# Patient Record
Sex: Female | Born: 1945
Health system: Southern US, Community
[De-identification: ages and names within clinical notes are randomized; demographics above are authoritative.]

## PROBLEM LIST (undated history)

## (undated) DIAGNOSIS — M2550 Pain in unspecified joint: Secondary | ICD-10-CM

## (undated) DIAGNOSIS — M7989 Other specified soft tissue disorders: Secondary | ICD-10-CM

## (undated) DIAGNOSIS — M549 Dorsalgia, unspecified: Secondary | ICD-10-CM

## (undated) DIAGNOSIS — I10 Essential (primary) hypertension: Secondary | ICD-10-CM

## (undated) DIAGNOSIS — K219 Gastro-esophageal reflux disease without esophagitis: Secondary | ICD-10-CM

## (undated) DIAGNOSIS — M1990 Unspecified osteoarthritis, unspecified site: Secondary | ICD-10-CM

## (undated) HISTORY — DX: Gastro-esophageal reflux disease without esophagitis: K21.9

## (undated) HISTORY — DX: Essential (primary) hypertension: I10

## (undated) HISTORY — DX: Dorsalgia, unspecified: M54.9

## (undated) HISTORY — DX: Pain in unspecified joint: M25.50

## (undated) HISTORY — DX: Other specified soft tissue disorders: M79.89

## (undated) HISTORY — DX: Unspecified osteoarthritis, unspecified site: M19.90

---

## 1998-01-02 ENCOUNTER — Other Ambulatory Visit: Admission: RE | Admit: 1998-01-02 | Discharge: 1998-01-02 | Payer: Self-pay | Admitting: Obstetrics and Gynecology

## 1999-01-17 ENCOUNTER — Other Ambulatory Visit: Admission: RE | Admit: 1999-01-17 | Discharge: 1999-01-17 | Payer: Self-pay | Admitting: Obstetrics and Gynecology

## 2000-03-16 ENCOUNTER — Other Ambulatory Visit: Admission: RE | Admit: 2000-03-16 | Discharge: 2000-03-16 | Payer: Self-pay | Admitting: Obstetrics and Gynecology

## 2001-04-01 ENCOUNTER — Other Ambulatory Visit: Admission: RE | Admit: 2001-04-01 | Discharge: 2001-04-01 | Payer: Self-pay | Admitting: Obstetrics and Gynecology

## 2002-04-04 ENCOUNTER — Other Ambulatory Visit: Admission: RE | Admit: 2002-04-04 | Discharge: 2002-04-04 | Payer: Self-pay | Admitting: Obstetrics and Gynecology

## 2003-12-25 ENCOUNTER — Emergency Department (HOSPITAL_COMMUNITY): Admission: EM | Admit: 2003-12-25 | Discharge: 2003-12-25 | Payer: Self-pay | Admitting: Emergency Medicine

## 2004-11-19 ENCOUNTER — Ambulatory Visit (HOSPITAL_COMMUNITY): Admission: RE | Admit: 2004-11-19 | Discharge: 2004-11-19 | Payer: Self-pay | Admitting: Obstetrics and Gynecology

## 2005-01-03 ENCOUNTER — Ambulatory Visit (HOSPITAL_COMMUNITY): Admission: RE | Admit: 2005-01-03 | Discharge: 2005-01-03 | Payer: Self-pay | Admitting: Obstetrics and Gynecology

## 2005-01-03 ENCOUNTER — Encounter (INDEPENDENT_AMBULATORY_CARE_PROVIDER_SITE_OTHER): Payer: Self-pay | Admitting: *Deleted

## 2005-04-21 ENCOUNTER — Ambulatory Visit (HOSPITAL_COMMUNITY): Admission: RE | Admit: 2005-04-21 | Discharge: 2005-04-21 | Payer: Self-pay | Admitting: Obstetrics and Gynecology

## 2005-09-03 IMAGING — CT CT HEAD W/O CM
1 of 2 series · 13 of 30 positions shown, 17 images · non-contrast
Comparison: none

CLINICAL DATA: Fell with headache. 
 HEAD CT WITHOUT CONTRAST
 5mm scans are made through the whole head.  Calvarium is unremarkable.  The paranasal sinuses, middle ears and mastoids are clear. The brain itself appears normal without evidence of atrophy, stroke, hemorrhage, hydrocephalus or extra-axial collection. 
 IMPRESSION 
 Normal exam.

[Series 2: brain · axial · 0.47mm/px · z∈[+136,+260]mm · 13 of 28 slices shown, 17 images]
[im 2/28  brain]
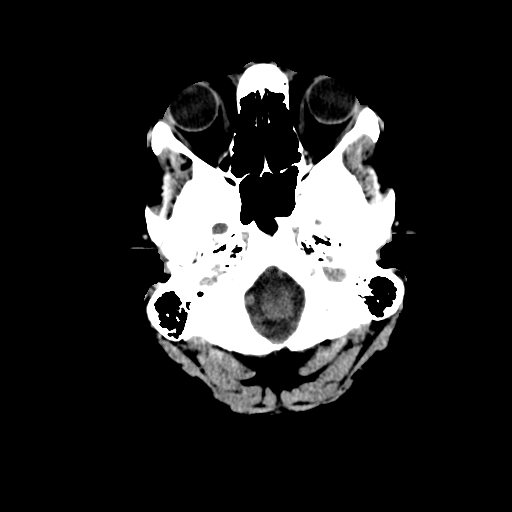
[im 2/28  bone]
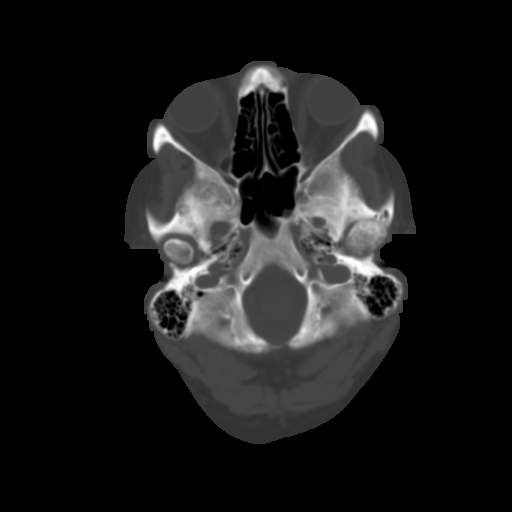
[im 4/28  brain]
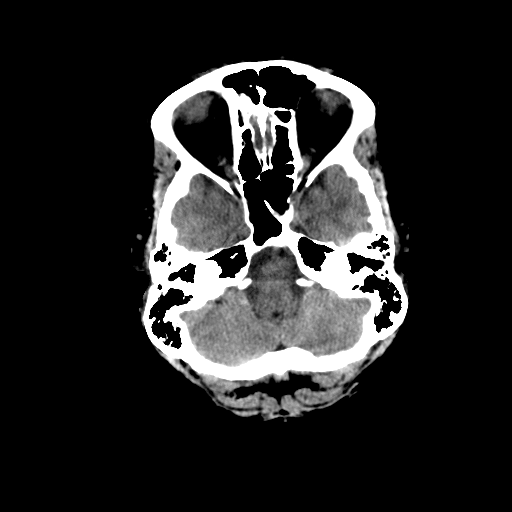
[im 6/28  brain]
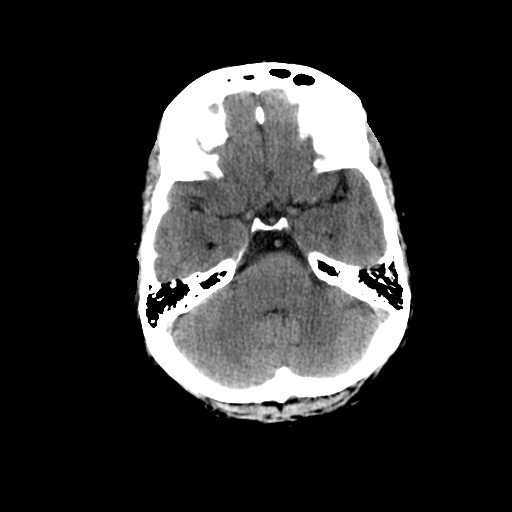
[im 8/28  brain]
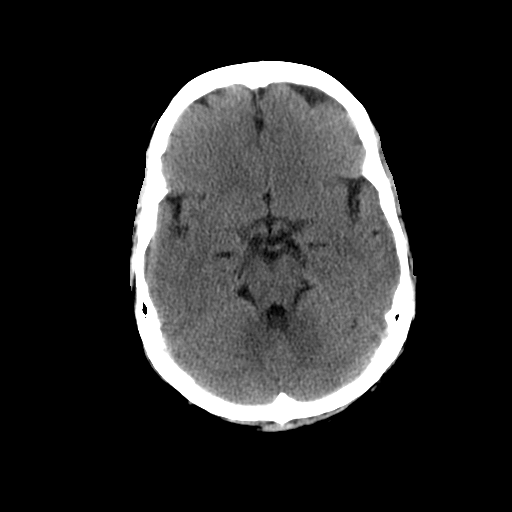
[im 10/28  brain]
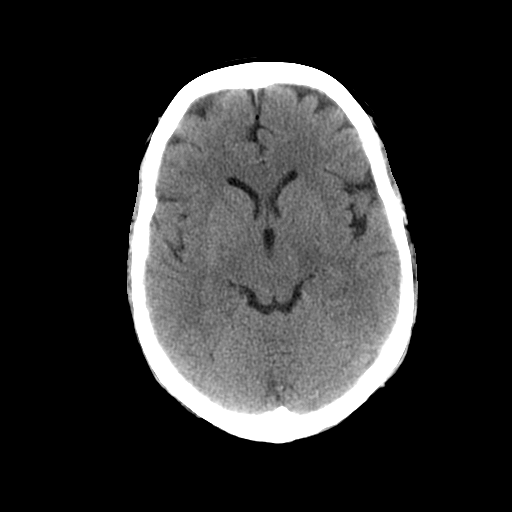
[im 10/28  bone]
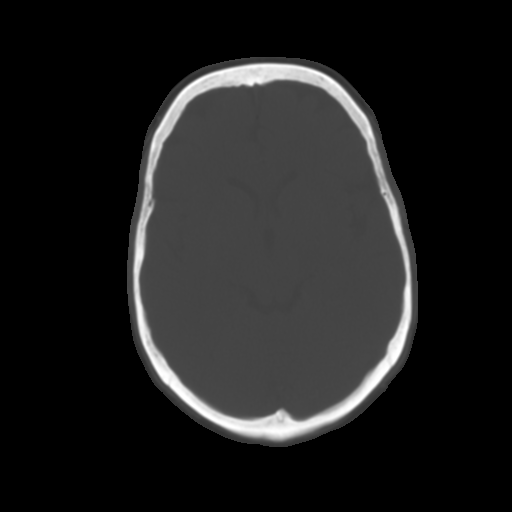
[im 12/28  brain]
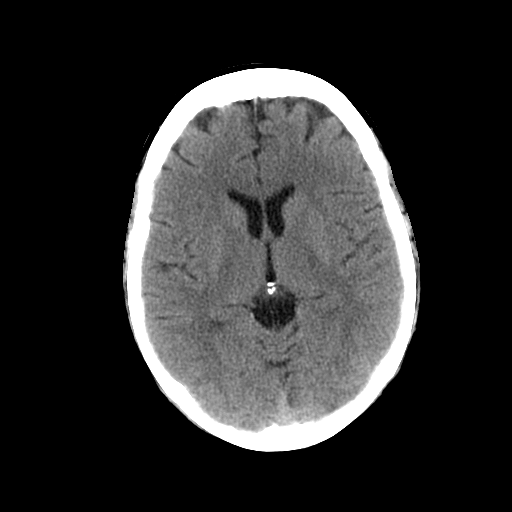
[im 14/28  brain]
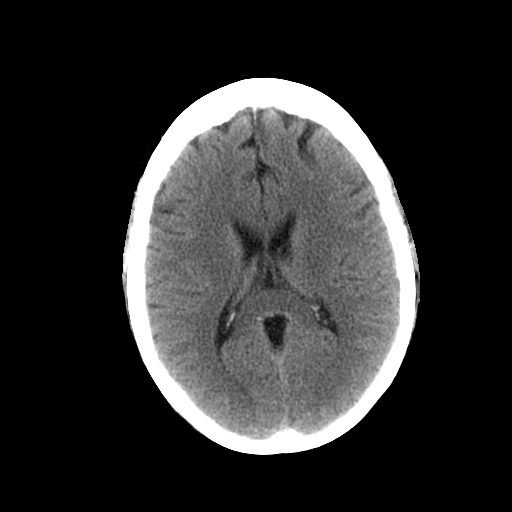
[im 16/28  brain]
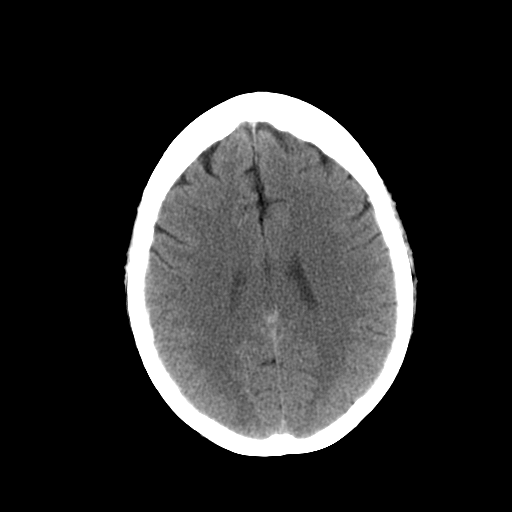
[im 18/28  brain]
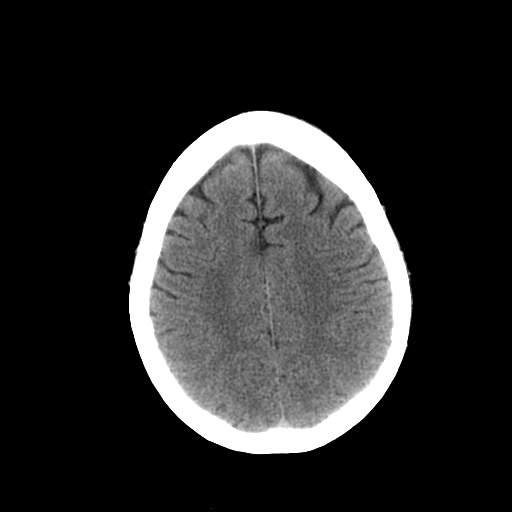
[im 18/28  bone]
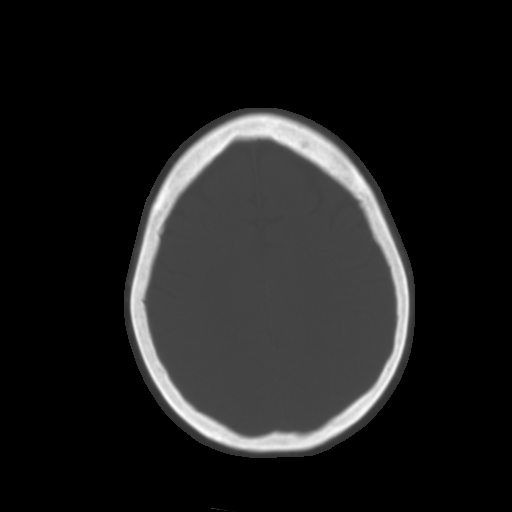
[im 20/28  brain]
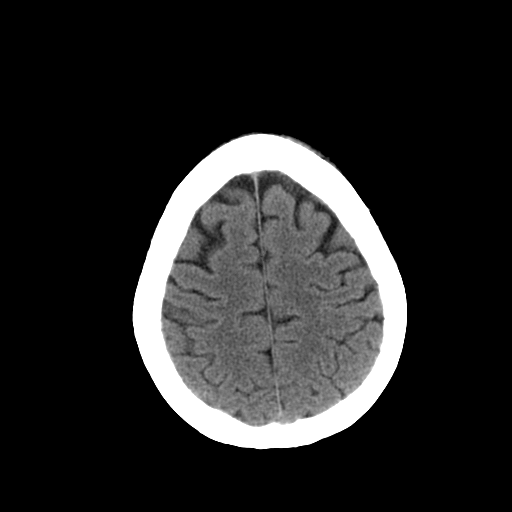
[im 22/28  brain]
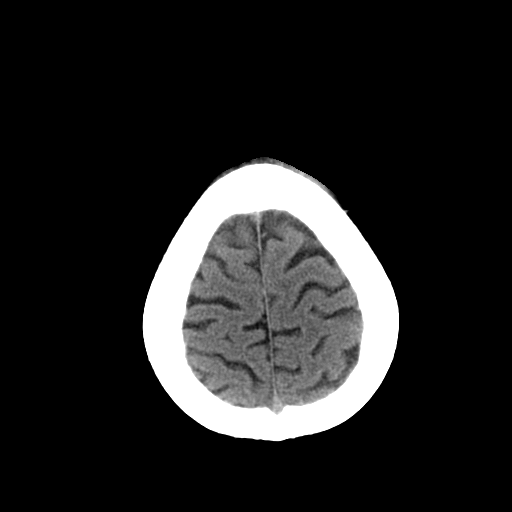
[im 24/28  brain]
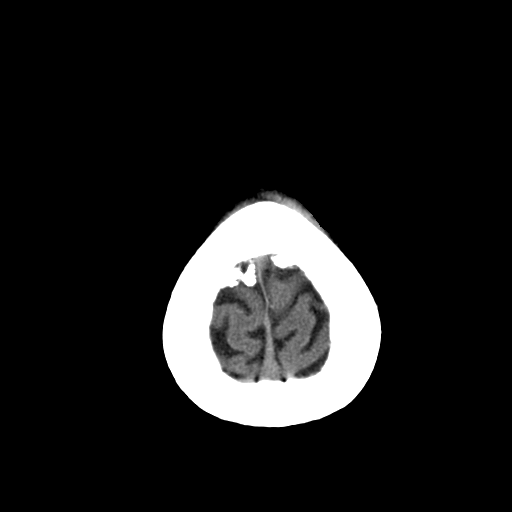
[im 26/28  brain]
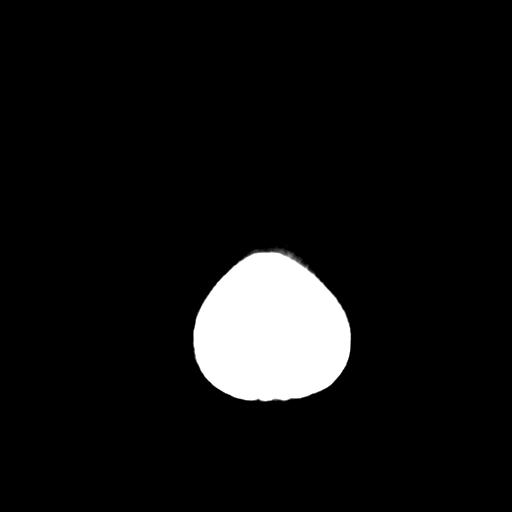
[im 26/28  bone]
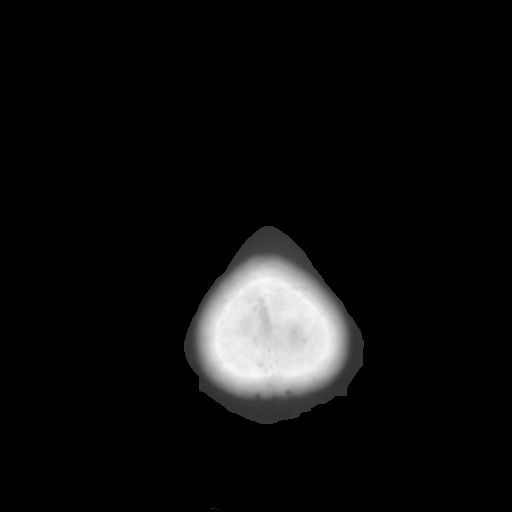

[13 of 30 positions shown; findings below may reference images not displayed]

## 2005-10-13 ENCOUNTER — Encounter (INDEPENDENT_AMBULATORY_CARE_PROVIDER_SITE_OTHER): Payer: Self-pay | Admitting: *Deleted

## 2005-10-13 ENCOUNTER — Ambulatory Visit (HOSPITAL_COMMUNITY): Admission: RE | Admit: 2005-10-13 | Discharge: 2005-10-13 | Payer: Self-pay | Admitting: Gastroenterology

## 2005-10-29 ENCOUNTER — Other Ambulatory Visit: Admission: RE | Admit: 2005-10-29 | Discharge: 2005-10-29 | Payer: Self-pay | Admitting: Obstetrics and Gynecology

## 2007-01-21 ENCOUNTER — Ambulatory Visit (HOSPITAL_COMMUNITY): Admission: RE | Admit: 2007-01-21 | Discharge: 2007-01-21 | Payer: Self-pay | Admitting: Obstetrics and Gynecology

## 2007-01-21 ENCOUNTER — Encounter (INDEPENDENT_AMBULATORY_CARE_PROVIDER_SITE_OTHER): Payer: Self-pay | Admitting: Obstetrics and Gynecology

## 2007-08-31 ENCOUNTER — Ambulatory Visit (HOSPITAL_COMMUNITY): Admission: RE | Admit: 2007-08-31 | Discharge: 2007-09-01 | Payer: Self-pay | Admitting: Obstetrics and Gynecology

## 2007-08-31 ENCOUNTER — Encounter (INDEPENDENT_AMBULATORY_CARE_PROVIDER_SITE_OTHER): Payer: Self-pay | Admitting: Obstetrics and Gynecology

## 2009-02-16 ENCOUNTER — Inpatient Hospital Stay (HOSPITAL_COMMUNITY): Admission: RE | Admit: 2009-02-16 | Discharge: 2009-02-19 | Payer: Self-pay | Admitting: Orthopedic Surgery

## 2009-08-17 ENCOUNTER — Inpatient Hospital Stay (HOSPITAL_COMMUNITY): Admission: RE | Admit: 2009-08-17 | Discharge: 2009-08-20 | Payer: Self-pay | Admitting: Orthopedic Surgery

## 2010-06-17 LAB — COMPREHENSIVE METABOLIC PANEL
ALT: 21 U/L (ref 0–35)
AST: 23 U/L (ref 0–37)
Albumin: 4.3 g/dL (ref 3.5–5.2)
Alkaline Phosphatase: 94 U/L (ref 39–117)
BUN: 9 mg/dL (ref 6–23)
CO2: 28 mEq/L (ref 19–32)
Calcium: 9.8 mg/dL (ref 8.4–10.5)
Chloride: 107 mEq/L (ref 96–112)
Creatinine, Ser: 0.71 mg/dL (ref 0.4–1.2)
GFR calc Af Amer: >60 mL/min (ref >60)
GFR calc non Af Amer: >60 mL/min (ref >60)
Glucose, Bld: 92 mg/dL (ref 70–99)
Potassium: 4.7 mEq/L (ref 3.5–5.1)
Sodium: 140 mEq/L (ref 135–145)
Total Bilirubin: 1.1 mg/dL (ref 0.3–1.2)
Total Protein: 6.8 g/dL (ref 6.0–8.3)

## 2010-06-17 LAB — CBC
HCT: 32.4 % — ABNORMAL LOW (ref 36.0–46.0)
HCT: 33.8 % — ABNORMAL LOW (ref 36.0–46.0)
HCT: 35.2 % — ABNORMAL LOW (ref 36.0–46.0)
HCT: 42.3 % (ref 36.0–46.0)
Hemoglobin: 11 g/dL — ABNORMAL LOW (ref 12.0–15.0)
Hemoglobin: 11.5 g/dL — ABNORMAL LOW (ref 12.0–15.0)
Hemoglobin: 12.1 g/dL (ref 12.0–15.0)
Hemoglobin: 14.6 g/dL (ref 12.0–15.0)
MCHC: 34 g/dL (ref 30.0–36.0)
MCHC: 34 g/dL (ref 30.0–36.0)
MCHC: 34.4 g/dL (ref 30.0–36.0)
MCHC: 34.4 g/dL (ref 30.0–36.0)
MCV: 88.6 fL (ref 78.0–100.0)
MCV: 89.1 fL (ref 78.0–100.0)
MCV: 89.1 fL (ref 78.0–100.0)
MCV: 89.1 fL (ref 78.0–100.0)
Platelets: 235 10*3/uL (ref 150–400)
Platelets: 261 10*3/uL (ref 150–400)
Platelets: 263 10*3/uL (ref 150–400)
Platelets: 303 10*3/uL (ref 150–400)
RBC: 3.64 MIL/uL — ABNORMAL LOW (ref 3.87–5.11)
RBC: 3.8 MIL/uL — ABNORMAL LOW (ref 3.87–5.11)
RBC: 3.95 MIL/uL (ref 3.87–5.11)
RBC: 4.78 MIL/uL (ref 3.87–5.11)
RDW: 14 % (ref 11.5–15.5)
RDW: 14.1 % (ref 11.5–15.5)
RDW: 14.1 % (ref 11.5–15.5)
RDW: 14.1 % (ref 11.5–15.5)
WBC: 11.4 10*3/uL — ABNORMAL HIGH (ref 4.0–10.5)
WBC: 11.6 10*3/uL — ABNORMAL HIGH (ref 4.0–10.5)
WBC: 12.4 10*3/uL — ABNORMAL HIGH (ref 4.0–10.5)
WBC: 8.4 10*3/uL (ref 4.0–10.5)

## 2010-06-17 LAB — PROTIME-INR
INR: 0.99 (ref 0.00–1.49)
INR: 1.1 (ref 0.00–1.49)
INR: 2.03 — ABNORMAL HIGH (ref 0.00–1.49)
INR: 2.22 — ABNORMAL HIGH (ref 0.00–1.49)
Prothrombin Time: 13 seconds (ref 11.6–15.2)
Prothrombin Time: 14.1 seconds (ref 11.6–15.2)
Prothrombin Time: 22.8 seconds — ABNORMAL HIGH (ref 11.6–15.2)
Prothrombin Time: 24.4 seconds — ABNORMAL HIGH (ref 11.6–15.2)

## 2010-06-17 LAB — BASIC METABOLIC PANEL
BUN: 5 mg/dL — ABNORMAL LOW (ref 6–23)
BUN: 5 mg/dL — ABNORMAL LOW (ref 6–23)
BUN: 5 mg/dL — ABNORMAL LOW (ref 6–23)
CO2: 26 mEq/L (ref 19–32)
CO2: 28 mEq/L (ref 19–32)
CO2: 29 mEq/L (ref 19–32)
Calcium: 8.6 mg/dL (ref 8.4–10.5)
Calcium: 8.6 mg/dL (ref 8.4–10.5)
Calcium: 8.9 mg/dL (ref 8.4–10.5)
Chloride: 103 mEq/L (ref 96–112)
Chloride: 103 mEq/L (ref 96–112)
Chloride: 105 mEq/L (ref 96–112)
Creatinine, Ser: 0.65 mg/dL (ref 0.4–1.2)
Creatinine, Ser: 0.73 mg/dL (ref 0.4–1.2)
Creatinine, Ser: 0.74 mg/dL (ref 0.4–1.2)
GFR calc Af Amer: >60 mL/min (ref >60)
GFR calc Af Amer: >60 mL/min (ref >60)
GFR calc Af Amer: >60 mL/min (ref >60)
GFR calc non Af Amer: >60 mL/min (ref >60)
GFR calc non Af Amer: >60 mL/min (ref >60)
GFR calc non Af Amer: >60 mL/min (ref >60)
Glucose, Bld: 105 mg/dL — ABNORMAL HIGH (ref 70–99)
Glucose, Bld: 112 mg/dL — ABNORMAL HIGH (ref 70–99)
Glucose, Bld: 129 mg/dL — ABNORMAL HIGH (ref 70–99)
Potassium: 3.6 mEq/L (ref 3.5–5.1)
Potassium: 3.6 mEq/L (ref 3.5–5.1)
Potassium: 3.8 mEq/L (ref 3.5–5.1)
Sodium: 136 mEq/L (ref 135–145)
Sodium: 137 mEq/L (ref 135–145)
Sodium: 141 mEq/L (ref 135–145)

## 2010-06-17 LAB — URINALYSIS, ROUTINE W REFLEX MICROSCOPIC
Bilirubin Urine: NEGATIVE
Glucose, UA: NEGATIVE mg/dL
Hgb urine dipstick: NEGATIVE
Ketones, ur: NEGATIVE mg/dL
Nitrite: NEGATIVE
Protein, ur: NEGATIVE mg/dL
Specific Gravity, Urine: 1.015 (ref 1.005–1.030)
Urobilinogen, UA: 0.2 mg/dL (ref 0.0–1.0)
pH: 6 (ref 5.0–8.0)

## 2010-06-17 LAB — URINE MICROSCOPIC-ADD ON

## 2010-06-17 LAB — TYPE AND SCREEN
ABO/RH(D): B POS
Antibody Screen: NEGATIVE

## 2010-06-17 LAB — DIFFERENTIAL
Basophils Absolute: 0 10*3/uL (ref 0.0–0.1)
Basophils Relative: 0 % (ref 0–1)
Eosinophils Absolute: 0.2 10*3/uL (ref 0.0–0.7)
Eosinophils Relative: 3 % (ref 0–5)
Lymphocytes Relative: 28 % (ref 12–46)
Lymphs Abs: 2.4 10*3/uL (ref 0.7–4.0)
Monocytes Absolute: 0.4 10*3/uL (ref 0.1–1.0)
Monocytes Relative: 4 % (ref 3–12)
Neutro Abs: 5.5 10*3/uL (ref 1.7–7.7)
Neutrophils Relative %: 65 % (ref 43–77)

## 2010-06-17 LAB — APTT: aPTT: 27 seconds (ref 24–37)

## 2010-07-03 LAB — ABO/RH: ABO/RH(D): B POS

## 2010-07-03 LAB — CBC
HCT: 30.9 % — ABNORMAL LOW (ref 36.0–46.0)
HCT: 33 % — ABNORMAL LOW (ref 36.0–46.0)
HCT: 33 % — ABNORMAL LOW (ref 36.0–46.0)
HCT: 45 % (ref 36.0–46.0)
Hemoglobin: 10.5 g/dL — ABNORMAL LOW (ref 12.0–15.0)
Hemoglobin: 11.4 g/dL — ABNORMAL LOW (ref 12.0–15.0)
Hemoglobin: 11.5 g/dL — ABNORMAL LOW (ref 12.0–15.0)
Hemoglobin: 15.4 g/dL — ABNORMAL HIGH (ref 12.0–15.0)
MCHC: 34 g/dL (ref 30.0–36.0)
MCHC: 34.3 g/dL (ref 30.0–36.0)
MCHC: 34.6 g/dL (ref 30.0–36.0)
MCHC: 34.8 g/dL (ref 30.0–36.0)
MCV: 89.6 fL (ref 78.0–100.0)
MCV: 89.9 fL (ref 78.0–100.0)
MCV: 90.1 fL (ref 78.0–100.0)
MCV: 91.3 fL (ref 78.0–100.0)
Platelets: 202 10*3/uL (ref 150–400)
Platelets: 212 10*3/uL (ref 150–400)
Platelets: 236 10*3/uL (ref 150–400)
Platelets: 316 10*3/uL (ref 150–400)
RBC: 3.39 MIL/uL — ABNORMAL LOW (ref 3.87–5.11)
RBC: 3.67 MIL/uL — ABNORMAL LOW (ref 3.87–5.11)
RBC: 3.68 MIL/uL — ABNORMAL LOW (ref 3.87–5.11)
RBC: 5 MIL/uL (ref 3.87–5.11)
RDW: 13.6 % (ref 11.5–15.5)
RDW: 13.6 % (ref 11.5–15.5)
RDW: 13.7 % (ref 11.5–15.5)
RDW: 13.8 % (ref 11.5–15.5)
WBC: 11.2 10*3/uL — ABNORMAL HIGH (ref 4.0–10.5)
WBC: 14.1 10*3/uL — ABNORMAL HIGH (ref 4.0–10.5)
WBC: 14.6 10*3/uL — ABNORMAL HIGH (ref 4.0–10.5)
WBC: 9.6 10*3/uL (ref 4.0–10.5)

## 2010-07-03 LAB — COMPREHENSIVE METABOLIC PANEL
ALT: 30 U/L (ref 0–35)
AST: 27 U/L (ref 0–37)
Albumin: 4.4 g/dL (ref 3.5–5.2)
Alkaline Phosphatase: 102 U/L (ref 39–117)
BUN: 15 mg/dL (ref 6–23)
CO2: 30 mEq/L (ref 19–32)
Calcium: 10 mg/dL (ref 8.4–10.5)
Chloride: 104 mEq/L (ref 96–112)
Creatinine, Ser: 0.76 mg/dL (ref 0.4–1.2)
GFR calc Af Amer: >60 mL/min (ref >60)
GFR calc non Af Amer: >60 mL/min (ref >60)
Glucose, Bld: 91 mg/dL (ref 70–99)
Potassium: 4.9 mEq/L (ref 3.5–5.1)
Sodium: 140 mEq/L (ref 135–145)
Total Bilirubin: 1 mg/dL (ref 0.3–1.2)
Total Protein: 7 g/dL (ref 6.0–8.3)

## 2010-07-03 LAB — PROTIME-INR
INR: 0.95 (ref 0.00–1.49)
INR: 1.1 (ref 0.00–1.49)
INR: 2.08 — ABNORMAL HIGH (ref 0.00–1.49)
INR: 2.22 — ABNORMAL HIGH (ref 0.00–1.49)
Prothrombin Time: 12.6 seconds (ref 11.6–15.2)
Prothrombin Time: 14.1 seconds (ref 11.6–15.2)
Prothrombin Time: 23.2 seconds — ABNORMAL HIGH (ref 11.6–15.2)
Prothrombin Time: 24.4 seconds — ABNORMAL HIGH (ref 11.6–15.2)

## 2010-07-03 LAB — BASIC METABOLIC PANEL
BUN: 6 mg/dL (ref 6–23)
CO2: 26 mEq/L (ref 19–32)
Calcium: 8.2 mg/dL — ABNORMAL LOW (ref 8.4–10.5)
Chloride: 102 mEq/L (ref 96–112)
Creatinine, Ser: 0.59 mg/dL (ref 0.4–1.2)
GFR calc Af Amer: >60 mL/min (ref >60)
GFR calc non Af Amer: >60 mL/min (ref >60)
Glucose, Bld: 121 mg/dL — ABNORMAL HIGH (ref 70–99)
Potassium: 3.5 mEq/L (ref 3.5–5.1)
Sodium: 133 mEq/L — ABNORMAL LOW (ref 135–145)

## 2010-07-03 LAB — DIFFERENTIAL
Basophils Absolute: 0.2 10*3/uL — ABNORMAL HIGH (ref 0.0–0.1)
Basophils Relative: 2 % — ABNORMAL HIGH (ref 0–1)
Eosinophils Absolute: 0.3 10*3/uL (ref 0.0–0.7)
Eosinophils Relative: 2 % (ref 0–5)
Lymphocytes Relative: 21 % (ref 12–46)
Lymphs Abs: 3 10*3/uL (ref 0.7–4.0)
Monocytes Absolute: 0.6 10*3/uL (ref 0.1–1.0)
Monocytes Relative: 5 % (ref 3–12)
Neutro Abs: 10.4 10*3/uL — ABNORMAL HIGH (ref 1.7–7.7)
Neutrophils Relative %: 71 % (ref 43–77)

## 2010-07-03 LAB — URINALYSIS, ROUTINE W REFLEX MICROSCOPIC
Bilirubin Urine: NEGATIVE
Glucose, UA: NEGATIVE mg/dL
Hgb urine dipstick: NEGATIVE
Ketones, ur: 15 mg/dL — AB
Nitrite: NEGATIVE
Protein, ur: NEGATIVE mg/dL
Specific Gravity, Urine: 1.011 (ref 1.005–1.030)
Urobilinogen, UA: 0.2 mg/dL (ref 0.0–1.0)
pH: 6 (ref 5.0–8.0)

## 2010-07-03 LAB — URINE MICROSCOPIC-ADD ON

## 2010-07-03 LAB — TYPE AND SCREEN
ABO/RH(D): B POS
Antibody Screen: NEGATIVE

## 2010-07-03 LAB — APTT: aPTT: 26 seconds (ref 24–37)

## 2010-07-25 ENCOUNTER — Other Ambulatory Visit: Payer: Self-pay | Admitting: Gastroenterology

## 2010-07-31 ENCOUNTER — Ambulatory Visit
Admission: RE | Admit: 2010-07-31 | Discharge: 2010-07-31 | Disposition: A | Discharge status: Home / Self Care | Payer: Medicare Other | Source: Ambulatory Visit | Attending: Gastroenterology | Admitting: Gastroenterology

## 2010-08-13 NOTE — H&P (Signed)
NAMEALYRIC, PARKIN                 ACCOUNT NO.:  [id number]   MEDICAL RECORD NO.:  [id number]          PATIENT TYPE:  AMB   LOCATION:                                FACILITY:  [facility]   PHYSICIAN:  Hal Morales, M.D.DATE OF BIRTH:  [birth year]   DATE OF ADMISSION:  08/31/2007  DATE OF DISCHARGE:                              HISTORY & PHYSICAL   HISTORY OF PRESENT ILLNESS:  [name] is a 65 year old divorced white  female para 2-0-0-2, who presents for laparoscopically-assisted vaginal  hysterectomy with bilateral salpingo-oophorectomy because of persistent  postmenopausal bleeding.  This patient has a longstanding history of  postmenopausal spotting which lately has only occurred whenever she has  an orgasm.  She denies any dyspareunia, pelvic pain, vaginitis symptoms,  changes in her bowel movements, or urinary tract symptoms.  In August  2006, the patient underwent an endometrial biopsy for her postmenopausal  spotting and it returned complex atypical hyperplasia with extensive  squamous metaplasia.  She subsequently had a hysteroscopy D&C in October  2006 which showed extensive squamous morule formation (which can be seen  in hyperplasia, cancer, or reactive normal endometrium).  At this time,  the patient was placed on daily Provera 30 mg for 90 days ago, during  which time she reported daily spotting.  Once the patient discontinued  her daily Provera, however, she ceased to bleed.  A followup endometrial  biopsy in January 2007 returned benign inactive endometrium and another  in April 2007 was also benign.  The patient had essentially no further  bleeding until August 2008, at which time she began to experience  spotting whenever she wipes along with spotting preceded by orgasm.  A  sonohystogram in August 2008 showed a uterus measuring 9.31 x 5.98 x  4.88 with white calcification measuring 0.72 x 0.52 x 1.1 cm.  The  patient's endometrium was assessed at 0.29 cm, but there was  noticed  anterior 2 or within the endometrium 3 masses measuring 0.66, 0.59, and  0.53 cm.  The myometrium was observed to have diffused uterine blood  flow which was suggestive of adenomyosis.  The patient later that month  underwent a pelvic MRI which confirmed the presence of adenomyosis and  documented that the patient's endometrium was thickened to between 11-12  mm.  A followup hysteroscopy D&C in October 2008 returned benign  findings.  The patient has continued to experience spotting with  orgasms, and at this time has decided to choose definitive therapy for  management, though she has been given both medical and surgical options.   OB HISTORY:  Gravida 2, para 2-0-0-2.  The patient had two spontaneous  vaginal births in [birth year] and 1980.   GYN HISTORY:  Menarche at 66 years old.  The patient is menopausal, has  had a bilateral tubal ligation.  Denies any history of sexually  transmitted diseases or abnormal Pap smears.  Last normal Pap smear was  August 2008.  Last normal mammogram was August 2008.   MEDICAL HISTORY:  Osteopenia, vitamin D deficiency, arthritis, pneumonia  on six separate occasions,  adenomatous endometrial hyperplasia,  hypertension, insomnia, adenomyosis, diverticulosis, fracture of  cervical spine at level of C2 in October 2005, and hyperlipidemia.   SURGICAL HISTORY:  Tonsillectomy 1954, 1977 D&C because of menorrhagia,  1982 bilateral tubal ligation, 2006 hysteroscopy with D&C showing  complex atypical hyperplasia, 2008 hysteroscopy D&C benign findings.  The patient admits to problems of awakening after anesthesia accompanied  by severe nausea.  Denies any history of blood transfusions.   FAMILY HISTORY:  Stroke, hypertension, diabetes mellitus, and COPD.   HABITS:  The patient does not use tobacco.  She drinks alcohol on  occasion and she is vegetarian.   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   CURRENT  MEDICATIONS:  1. Tramadol 50 mg at bedtime.  2. Etolac 400 mg as needed.  3. Cyclobenzaprine 10 mg three times daily as needed.  4. Amlodipine 10 mg daily.  5. Ambien CR 12.5 mg as needed.  6. Multivitamin one tablet daily.  7. Vitamin C with a B complex one tablet daily.  8. Calcium with vitamin D daily greens with soy daily.   ALLERGIES:  TETANUS TOXOID which causes her to swell.   REVIEW OF SYSTEMS:  The patient wears glasses.  She has bilateral knee  pain secondary to a recent fall.  Admits to occasional palpitations.  Denies any chest pain, shortness of breath, dizziness, headache, nausea,  vomiting, diarrhea, fever, and except as mentioned in history of present  illness, the patient's review of systems is negative.   PHYSICAL EXAMINATION:  Blood pressure 124/80, pulse is 100, weight is  198, height is 5 feet.  GENERAL:  Neck is supple without masses.  There is no thyromegaly or  cervical adenopathy.  HEART:  Regular rhythm, though the patient is mildly tachycardiac with a  heart rate of 100.  LUNGS:  Clear.  BACK:  No CVA tenderness.  ABDOMEN:  No tenderness, masses, or organomegaly.  EXTREMITIES:  No clubbing, cyanosis, or edema.  PELVIC:  EGBUS is within normal limits.  Vagina is atrophic.  Cervix is  nontender without lesions.  Uterus appears normal size, shape, and  consistency without tenderness, though the patient's exam is limited by  body habitus.  Adnexa, no tenderness or masses.   IMPRESSION:  1. Postmenopausal bleeding.  2. Adenomyosis.  3. History of adenomatous endometrial hyperplasia.   DISPOSITION:  A discussion was held with the patient regarding the  indications for her procedure along with its risks which include but are  not limited to reaction to anesthesia, damage to adjacent organs,  infection, and excessive bleeding.  The patient was given a MiraLax  bowel prep to be administered 24 hours prior to her procedure.  She has  consented to proceed  with a laparoscopically-assisted vaginal  hysterectomy with bilateral salpingo-oophorectomy with the possibility  of a total abdominal hysterectomy and bilateral salpingo-oophorectomy at  [facility] [name] on August 31, 2007, at 7:30 a.m.      Elmira J. Adline Peals.      Hal Morales, M.D.  Electronically Signed    EJP/MEDQ  D:  08/29/2007  T:  08/30/2007  Job:  [id number]

## 2010-08-13 NOTE — Op Note (Signed)
[name], [name]                 ACCOUNT NO.:  [id number]   MEDICAL RECORD NO.:  [id number]          PATIENT TYPE:  AMB   LOCATION:  SDC                           FACILITY:  [facility]   PHYSICIAN:  Hal Morales, M.D.DATE OF BIRTH:  1945/06/15   DATE OF PROCEDURE:  01/21/2007  DATE OF DISCHARGE:                               OPERATIVE REPORT   PREOPERATIVE DIAGNOSES:  1. Postmenopausal bleeding.  2. Adenomyosis.   POSTOPERATIVE DIAGNOSES:  1. Postmenopausal bleeding.  2. Adenomyosis.  3. Atrophic endometrium.   PROCEDURE:  Hysteroscopy, dilatation and curettage.   SURGEON:  Hal Morales, M.D.   ANESTHESIA:  General orotracheal.   ESTIMATED BLOOD LOSS:  Less than 10 mL.   COMPLICATIONS:  None.   FINDINGS:  The uterus sounded to 8 cm.  At the time of hysteroscopy the  endometrium was very thin with no endometrial lesions.   SPECIMENS TO PATHOLOGY:  Endometrial curettings and endocervical  curettings.   PROCEDURE:  The patient was taken to the operating room after  appropriate identification and placed on the operating table.  After the  attainment of adequate general anesthesia, she was placed in the  lithotomy position.  The perineum and vagina were prepped with multiple  layers of Betadine and the bladder emptied with an in-and-out catheter.  The perineum was draped as a sterile field.  A Graves speculum was  placed in the vagina and a single-tooth tenaculum placed on the anterior  cervix.  The uterus was sounded.  The cervix was dilated to accommodate  a small hysteroscope and the hysteroscope was used to identify and  document the above-noted findings.  The hysteroscope was removed and all  four quadrants of the endocervical canal curetted and all four quadrants  of the  endometrial canal curetted.  All instruments were then removed from the  vagina and the patient awakened from general anesthesia and taken to the  recovery room in satisfactory condition,  having tolerated the procedure  well with sponge and instrument count correct.      Hal Morales, M.D.  Electronically Signed     VPH/MEDQ  D:  01/21/2007  T:  01/22/2007  Job:  [id number]

## 2010-08-13 NOTE — H&P (Signed)
NAMEJODEAN, [name]                 ACCOUNT NO.:  [id number]   MEDICAL RECORD NO.:  [id number]          PATIENT TYPE:  AMB   LOCATION:  SDC                           [facility]   PHYSICIAN:  Hal Morales, M.D.DATE OF BIRTH:  19-Oct-1945   DATE OF ADMISSION:  DATE OF DISCHARGE:                              HISTORY & PHYSICAL   ADDENDUM:   IMPRESSION:  Tachycardia.   DISPOSITION:  The patient was seen by her primary care physician, Dr.  Andi Devon and was cleared for surgery in relationship to her  tachycardia.      [name].      Hal Morales, M.D.  Electronically Signed    EJP/MEDQ  D:  08/30/2007  T:  08/31/2007  Job:  [id number]

## 2010-08-13 NOTE — H&P (Signed)
[name], [name]                 ACCOUNT NO.:  [id number]   MEDICAL RECORD NO.:  [id number]          PATIENT TYPE:  AMB   LOCATION:                                [facility]   PHYSICIAN:  Hal Morales, M.D.DATE OF BIRTH:  05/20/1945   DATE OF ADMISSION:  01/21/2007  DATE OF DISCHARGE:  01/21/2007                              HISTORY & PHYSICAL   The patient is a 65 year old white female who presents for further  evaluation of postmenopausal bleeding.  The patient had been in her  usual state of gynecologic health until the spring of 2008 when she  began having episodes of occasional bleeding with wiping after  urination.  She had two to three episodes that may last for up to 24  hours.  She noted that these occurred only after recent orgasm.  The  patient underwent a workup which included a pelvic ultrasound which  showed normal right and left ovary.  There was a uterine calcification  which was 1.1 cm.  The endometrial stripe appeared thin but there were  masses that were seen either within the endometrial cavity or anterior  to the cavity.  Sonohysterogram could not be performed secondary to  inability to thread the catheter via the patient's cervix.  For that  reason an MRI of the pelvis was performed which showed the endometrium  to be slightly thickened measuring 11-12 mm.  There was also change in  the endometrium consistent with adenomyosis.  The uterus was felt to be  slightly enlarged for a patient of this age measuring 5 x 8 x 6.5.  There was no evidence of free fluid in the cul-de-sac and no adnexal or  ovarian masses were noted.  The patient has a previous history of  atypical complex hyperplasia in 2006 for which a hysteroscopy D&C were  done.  In light of these facts, it was felt that a pathologic evaluation  of the endometrial cavity was needed and the patient was given the  option of proceeding with hysteroscopy D&C,hysteroscopy D&C followed by  Provera  indefinitely,to decrease the risk of recurrent postmenopausal  bleeding,hysteroscopy followed later by hysterectomy and BSO to  eliminate the concern of postmenopausal bleeding.  The patient has  decided to proceed with hysteroscopy D&C.   PAST MEDICAL HISTORY:  Positive for arthritis and a remote history of  pneumonia.  She has insomnia and hypertension.   OBSTETRICAL HISTORY:  The patient had spontaneous vaginal deliveries in  1978 and 1980.   SURGICAL HISTORY:  D&C in 1977 and 2006 as well as tubal ligation.   CURRENT MEDICATIONS:  Norvasc, Flexeril, Ambien, Ultram, calcium and  vitamin D.   DRUG SENSITIVITIES:  TETANUS TOXOID.   FAMILY HISTORY:  Positive for stroke, lung disease, hypertension.  She  denies a family history of breast, colon, ovarian, cervical or  endometrial cancer.   REVIEW OF SYSTEMS:  Positive for acid reflux, hemorrhoids, indigestion,  weight loss, neck pain, insomnia.   PHYSICAL EXAMINATION:  The patient is an obese white female in no acute  distress.  The blood  pressure is 110/100, weight is 203 pounds.  LUNGS:  Clear.  HEART:  Regular rate and rhythm.  The abdomen is obese without  palpable masses, organomegaly.  Pelvic EGBUS within normal limits.  The  vagina is atrophic.  The cervix is flush with the vaginal apex.  The  uterus does not feel enlarged but the examination is compromised by the  patient's habitus.  Adnexa no masses.  Rectovaginal no masses.   IMPRESSION:  1. Recurrent postmenopausal bleeding.  2. History of adenomatous hyperplasia.  3. Adenomyosis on MRI.   DISPOSITION:  A discussion is held with the patient concerning  indications for her procedure as well as the risks involved which  include but are not limited to anesthesia, bleeding, infection, damage  to adjacent organs and uterine perforation.  The patient wishes to  proceed with hysteroscopy D&C.  This will be done at [facility] on  January 21, 2007.      Hal Morales, M.D.  Electronically Signed     VPH/MEDQ  D:  01/19/2007  T:  01/20/2007  Job:  [id number]

## 2010-08-13 NOTE — Op Note (Signed)
[name], [name]                 ACCOUNT NO.:  [id number]   MEDICAL RECORD NO.:  [id number]          PATIENT TYPE:  OIB   LOCATION:  9311                          [facility]ACILITY:  [facility]   PHYSICIAN:  Hal Morales, M.D.DATE OF BIRTH:  Apr 26, 1945   DATE OF PROCEDURE:  08/31/2007  DATE OF DISCHARGE:                               OPERATIVE REPORT   PREOPERATIVE DIAGNOSES:  Postmenopausal bleeding, history of endometrial  hyperplasia, possible adenomyosis.   POSTOPERATIVE DIAGNOSES:  Postmenopausal bleeding, history of  endometrial hyperplasia, possible adenomyosis.   OPERATIONS:  Laparoscopically-assisted vaginal hysterectomy and  bilateral salpingo-oophorectomy and McCall culdoplasty with vaginal  packing.   SURGEON:  Hal Morales, MD   FIRST ASSISTANT:  Elmira J. Lowell Guitar, certified Advice worker.   ANESTHESIA:  General orotracheal.   ESTIMATED BLOOD LOSS:  200 mL.   COMPLICATIONS:  None.   FINDINGS:  The uterus was upper limits of normal size and contained a 3-  mm right fundal myoma.  The tubes were status post tubal ligation for  sterilization.  The ovaries were within normal limits.  The left ovary  was adherent to the left posterior uterus.  The left tube seemed to have  been removed except for the fimbriated end.   PROCEDURE:  The patient was taken to the operating room after  appropriate identification and placed on the operating table.  After the  attainment of adequate general anesthesia, she was placed in the  modified lithotomy position.  The abdomen, perineum, and vagina were  prepped with multiple layers of Betadine and a Hulka tenaculum was  placed on the anterior cervix.  A Foley catheter was inserted into the  bladder under sterile conditions and connected to straight drainage.  The abdomen and perineum were draped as a sterile field.  Subumbilical  and suprapubic injection of 0.25% Marcaine for a total of 10 mL was  undertaken.  A subumbilical  incision was made and a Veress cannula was  placed through that incision into the perineal cavity.  A  pneumoperitoneum was created with 4 L of CO2.  The Veress cannula was  removed and laparoscopic trocar was placed through that incision into  the peritoneal cavity.  The laparoscope was placed through the trocar  sleeve.  Suprapubic incisions were made to the right and left in the  midline and laparoscopic probe trocars were placed through those  incisions into the peritoneal cavity under direct visualization.  The  above-noted findings were made.  The right round ligament was then  cauterized and incised and that incision taken on the anterior leaf of  the broad ligament.  The right utero-ovarian ligament and right  tubouterine connection were then cauterized and cut.  The ureter was  identified.  The peritoneum was skeletonized to allow skeletonization of  the infundibulopelvic ligament.  Two Endoloops of 0 Vicryl were then  placed around the infundibulopelvic ligament and the ovary was excised  and placed in the posterior cul-de-sac.  A similar procedure was carried  out on the opposite side with the round ligament.  The utero-ovarian  ligament and  isolation of the infundibulopelvic ligament for suture  ligation with Endoloops of 0 Vicryl.  The ovary was excised from the  infundibulopelvic ligament and placed in the posterior cul-de-sac for  future retrieval.  At this time, the pneumoperitoneum was released and  the vagina became the site of operation.  The laparoscope was removed  and the light was discontinued.  A weighted speculum was placed in the  posterior vagina and a dilute solution of Pitressin was used to inject  the cervicovaginal mucosa and cervix.  A total of 36 mL was used.  The  cervix was circumscribed and the anterior vaginal mucosa was dissected  bluntly off the anterior cervix.  The posterior cul-de-sac was then  entered sharply intact.  The uterosacral ligaments on  the right and left  side were clamped, cut, and suture ligated.  The paracervical tissues on  the right and left side were clamped, cut, and suture ligated.  The  uterine arteries on the right and left side were clamped, cut, and  suture ligated.  The anterior peritoneum was entered during this process  and the bladder blade was placed.  The uterus was inverted and the  parametrial tissues were clamped, cut, and suture ligated.  The uterus  was removed from the operative field.  Hemostatic sutures were placed in  the left vaginal cuff for adequate hemostasis.  McCall culdoplasty  sutures were then placed in the posterior cul-de-sac after removing the  tubes and ovaries from the posterior cul-de-sac.  The McCall culdoplasty  sutures were incorporated the 2 uterosacral ligaments and the  intervening posterior peritoneum.  A total of 3 sutures were placed and  held.  The vaginal cuff closure was begun with the sutures from the  uterosacral ligaments, which were used to create the anterior and  posterior portions of the vaginal angle suture and tied down.  The  remainder of the vaginal cuff was closed in a single layer incorporating  anterior vaginal mucosa, anterior peritoneum, posterior peritoneum, and  posterior vaginal mucosa in each figure-of-eight suture.  Once the  vaginal cuff had been closed, the McCall culdoplasty sutures were then  tied down.  Vaginal packing, which had been moistened with Estrace  cream, was placed in the vagina.  The surgeon changed gloves and the  pneumoperitoneum was recreated.  The laparoscope was used to evaluate  the pelvis and a small area of bleeding at the right pelvic sidewall was  cauterized with adequate hemostasis.  A similar procedure was carried  out with a small bleeding point on the left pelvic sidewall.  Copious  irrigation was carried out and approximately 60 mL of warm lactated  Ringer's was left in the peritoneal cavity.  All of the instruments  were  removed under direct visualization as the CO2 was allowed to escape.  The subumbilical incision was closed with a fascial suture of 0 Vicryl  in a figure-of-eight fashion.  The subcuticular suture of 3-0 Vicryl was  used to close that skin incision.  The suprapubic skin incisions were  closed with Dermabond.  The patient was awakened from general anesthesia  and taken to the recovery room in satisfactory condition having  tolerated the procedure well with sponge and instrument counts correct.   SPECIMENS TO PATHOLOGY:  Uterus and cervix, uterine fibroid, right and  left tubes, and ovaries.      Hal Morales, M.D.  Electronically Signed     VPH/MEDQ  D:  08/31/2007  T:  09/01/2007  Job:  [id number]

## 2010-08-13 NOTE — H&P (Signed)
[name], HIGHLAND                 ACCOUNT NO.:  [id number]   MEDICAL RECORD NO.:  [id number]           PATIENT TYPE:   LOCATION:                                 FACILITY:   PHYSICIAN:  Hal Morales, M.D.DATE OF BIRTH:  March 08, 1946   DATE OF ADMISSION:  DATE OF DISCHARGE:                              HISTORY & PHYSICAL   HISTORY OF PRESENT ILLNESS:  [name] is a 65 year old divorced white  female para 2-0-0-2, who presents for laparoscopically-assisted vaginal  hysterectomy with bilateral salpingo-oophorectomy because of persistent  postmenopausal bleeding.  This patient has a long-standing history of  postmenopausal spotting, which of late, only occurs with orgasm.  She  denies any dyspareunia, pelvic pain, vaginitis symptoms, changes in her  bowel habits, or urinary tract symptoms.  In August 2006, the patient  underwent an endometrial biopsy for postmenopausal bleeding, which  returned complex atypical hyperplasia with extensive squamous  metaplasia.  The patient was then given a hysteroscopy with D and C in  October 2006, which revealed extensive squamous morule formation, which  can be seen in hyperplasia, cancer, or reactive yet normal endometrium.  It was at this time, the patient was placed on Provera 30 mg daily for 3  months, during which time, the patient reports daily bleeding.  When she  completed her 90-day course of Provera; however, the patient's bleeding  stopped.  A followup endometrial biopsy in January 2009 showed benign  inactive endometrium.  Another, in April 2007, also had benign findings.  The patient was without any significant bleeding until August 2008, at  which time, she began spotting with wiping and also with orgasm.  A  sonohystogram in August 2008 showed a uterus measuring 9.31 x 5.98 x  4.88 accompanied by calcifications measuring 0.72 x 0.52 x 1.1 cm; and  endometrium which was 0.29 cm (though measurement was somewhat obscured  by the  patient's inhomogeneous endometrium).  The patient had on the  anterior or within the endometrium, 3 observable masses, which measured  0.66 cm, 0.59 cm, and 0.53 cm respectively.  There was diffuse uterine  blood flow observed, which was highly suggestive of adenomyosis.  A  pelvic MRI was later performed in August 2008, which confirmed  adenomyosis and also a thickened endometrium, which was felt to be 11-12  mm.  In October 2008, the patient underwent another hysteroscopy with D  and C; however, those findings were benign.  The patient was given both  medical and surgical management options for her symptoms; however, the  patient has chosen definitive therapy in the form of hysterectomy, but  chose at that time to wait until more convenient, and the Mirena IUD.      Elmira J. Adline Peals.      Hal Morales, M.D.  Electronically Signed    EJP/MEDQ  D:  08/29/2007  T:  08/30/2007  Job:  [id number]

## 2010-08-16 NOTE — H&P (Signed)
[name], [name]                 ACCOUNT NO.:  [id number]   MEDICAL RECORD NO.:  [id number]          PATIENT TYPE:  AMB   LOCATION:  SDC                           FACILITY:  [facility]   PHYSICIAN:  Hal Morales, M.D.DATE OF BIRTH:  1945-07-03   DATE OF ADMISSION:  DATE OF DISCHARGE:                                HISTORY & PHYSICAL   HISTORY OF PRESENT ILLNESS:  [name] is a 65 year old menopausal divorced  female, para 2-0-0-2, who presents for hysteroscopy with D&C because of  postmenopausal bleeding and complex atypical endometrial hyperplasia.  After  5 years of amenorrhea due to menopause, the patient began bleeding at the  end of July 2006.  During this episode she changed a pad every 1 to 1-1/2  hours, however, it abated after 5 days only to resume 4 days later.  Since  that time the patient has had intermittent spotting alternating with  irregular menstrual flow in which she changes a pad twice daily.  She denies  any cramping, vaginitis symptoms, change in bowel movements, or fever.  An  ultrasound in August of 2006 showed a uterus measuring 10.4 x 4.4 x 6.5 cm  and an indistinct myometrial change (questionable adenomyosis) along with an  endometrial stripe measuring 14 mm with cystic changes.  A left ovarian cyst  measuring 1.5 cm was also noted.  An endometrial biopsy followed this  procedure which revealed complex atypical hyperplasia.  The condition of  complex hyperplasia with atypia was discussed with the patient along with  the recommendations for hysteroscopy with D&C.  She has consented to proceed  with this operation.   PAST MEDICAL HISTORY:   PAST OBSTETRICAL HISTORY:  Gravida 2, para 2-0-0-2.  All of the patient's  deliveries were vaginal.   GYN HISTORY:  Menarche 65 years old.  The patient's menstrual periods prior  to menopause occur twice monthly (see history of present illness).  She has  used bilateral tubal ligation as a method of contraception.  Denies  any  history of sexually transmitted disease or abnormal Pap smears.  The  patient's last normal mammogram was April of 2006 as well as her last normal  Pap smear.   PAST MEDICAL HISTORY:  Fracture of her cervical spine at the level of C2 in  October of 2005.  Pneumonia on six occasions, last episode 20 years ago.  Hyperlipidemia and hypertension.   PAST SURGICAL HISTORY:  1954 tonsillectomy.  1977 D&C because of  menorrhagia.  1982 bilateral tubal ligation.  The patient admits to having  problems awakening from anesthesia and severe nausea which lasts for months  after being anesthetized.  She denies any history of blood transfusions.   FAMILY HISTORY:  Positive for hypertension, lung cancer, and diabetes  mellitus.   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   HABITS:  She does not use tobacco.  She occasionally consumes alcohol.   CURRENT MEDICATIONS:  1.  Norvasc 10 mg daily.  2.  Etodolac 400 mg daily.  3.  Ultram 50 mg as needed.  4.  Flexeril 10 mg as needed.  5.  Potassium daily.  6.  Zyrtec 10 mg daily.  7.  Vitamin C one tablet daily.  8.  B complex one tablet daily.  9.  Calcium 1000 mg daily.  10. Garlic one tablet daily.  11. OPC-3 daily.  12. Antioxidants daily (the patient was advised to discontinue her vitamin      C, garlic, and OPC 7 days prior to her surgical procedure).   ALLERGIES:  TETANUS which causes swelling.   REVIEW OF SYSTEMS:  Positive for neck, knee, and back arthritis.  The  patient does wear glasses.  Otherwise except as mentioned in history of  present illness, the patient's review of systems is negative.   PHYSICAL EXAMINATION:  VITAL SIGNS:  Blood pressure 130/90, weight is 233  pounds, height if 5 feet 1 inch tall.  NECK:  Supple without masses.  There is no adenopathy or thyromegaly.  HEART:  Regular rate and rhythm.  There is no murmur.  LUNGS:  Clear to auscultation.  There are no wheezes, rales, or rhonchi.   BACK:  No CVA tenderness.  ABDOMEN:  Bowel sounds are present.  It is soft without tenderness,  guarding, rebound, or organomegaly, though examination is limited by body  habitus.  EXTREMITIES:  Without clubbing, cyanosis, or edema.  PELVIC:  EGBUS is within normal limits.  Vagina is normal.  Cervix is  nontender without lesions.  Uterus appears normal size, shape, and  consistency though examination is limited by body habitus.  Rectovaginal  examination is without any masses or tenderness.   IMPRESSION:  1.  Complex atypical endometrial hyperplasia.  2.  Postmenopausal bleeding.   DISPOSITION:  A discussion was held with the patient regarding the  indications for her procedure along with its risks which include but are not  limited to reaction to anesthesia, damage to adjacent organs, infection, and  excessive bleeding.  The patient was given a copy of the ACOG brochure on  hysteroscopy and she is scheduled to undergo her procedure at [facility] on January 03, 2005, at 9:30 a.m.      [place] J. Adline Peals.      Hal Morales, M.D.  Electronically Signed    EJP/MEDQ  D:  12/27/2004  T:  12/27/2004  Job:  [id number]

## 2010-08-16 NOTE — Discharge Summary (Signed)
[name], [name]                 ACCOUNT NO.:  [id number]   MEDICAL RECORD NO.:  [id number]          PATIENT TYPE:  OIB   LOCATION:  9311                          FACILITY:  [facility]   PHYSICIAN:  [name], M.D.DATE OF BIRTH:  Nov 03, 1945   DATE OF ADMISSION:  08/31/2007  DATE OF DISCHARGE:  09/01/2007                               DISCHARGE SUMMARY   DISCHARGE DIAGNOSES:  Adenomyosis, postmenopausal bleeding, history of  adenomatous endometrial hyperplasia.   OPERATIONS:  On the date of admission, the patient underwent a  laparoscopically-assisted vaginal hysterectomy with bilateral salpingo-  oophorectomy, tolerating procedure well.   HISTORY OF PRESENT ILLNESS:  [name] is a 65 year old divorced white  female para 2-0-0-2 who presents for a laparoscopically-assisted vaginal  hysterectomy with bilateral salpingo-oophorectomy because of persistent  postmenopausal bleeding and a history of adenomatous endometrial  hyperplasia.  Please see the patient's dictated history and physical  examination for details.   PREOPERATIVE PHYSICAL EXAM:  VITAL SIGNS:  Blood pressure 124/80, pulse  was 100, weight was 198, height 5 feet.  GENERAL:  Within normal limits though the patient was noted to be mildly  tachycardiac.  PELVIC EXAM:  EGBUS was within normal limits.  Vagina was atrophic.  Cervix was nontender without lesions.  Uterus appeared normal size,  shape and consistency without tenderness though the patient's exam was  limited by body habitus.  Adnexa was without tenderness or masses.   HOSPITAL COURSE:  On the date of admission, the patient underwent afore-  mentioned procedures tolerating them all well.  Postoperative course was  unremarkable with the patient tolerating a postop hemoglobin of 13.2  (preop hemoglobin 15.6).  By postop day #1, the patient had resumed  bowel and bladder functions and was therefore deemed ready for discharge  home.   DISCHARGE MEDICATIONS:  The  patient was referred to her home medication  reconciliation form.  Additionally, she was prescribed, Percocet 325 one-  two tablets every 4 hours as needed for pain.  She was advised not to  take this with her tramadol, ibuprofen 600 mg with food every 6 hours  for 5 days then as needed for pain.  She was advised not to take this  with her etodolac.  Colace 100 mg twice daily until her bowel movements  were regular.   FOLLOW UP:  The patient is scheduled for a postoperative visit with Dr.  Pennie Johnson, October 12, 2007 at 10:30 a.m.   DISCHARGE INSTRUCTIONS:  The patient was given a copy of [facility] postoperative instruction sheet.  She was further  advised to avoid driving for 2 weeks, heavy lifting for 4 weeks,  intercourse for 6 weeks, that she may shower, she may walk up steps.  Her diet is to be that of a low sodium diet.   FINAL PATHOLOGY:  Uterus, bilateral ovaries and fallopian tubes:  Uterine cervix - benign ectocervical and endocervical mucosa with  chronic cervicitis and benign endocervical inclusion cyst; uterine  corpus - benign weakly proliferative endometrium, extensive adenomyosis,  adenoma, adenomyoma with lipomatous metaplasia and one benign intramural  leiomyoma; benign bilateral ovaries and bilateral benign fallopian  tubes.      [name].      [name], M.D.  Electronically Signed    [name]/MEDQ  D:  09/15/2007  T:  09/16/2007  Job:  [id number]

## 2010-08-16 NOTE — Op Note (Signed)
[name], [name]                 ACCOUNT NO.:  [id number]   MEDICAL RECORD NO.:  [id number]          PATIENT TYPE:  AMB   LOCATION:  SDC                           FACILITY:  [facility]   PHYSICIAN:  Hal Morales, M.D.DATE OF BIRTH:  28-May-1945   DATE OF PROCEDURE:  01/03/2005  DATE OF DISCHARGE:                                 OPERATIVE REPORT   PREOPERATIVE DIAGNOSIS:  Atypical adenomatous hyperplasia of the  endometrium.   POSTOPERATIVE DIAGNOSIS:  Atypical adenomatous hyperplasia of the  endometrium.   OPERATION:  Hysteroscopy D&C.   ANESTHESIA:  General.   ESTIMATED BLOOD LOSS:  Less than 10 mL.   COMPLICATIONS:  Malfunction of the hysteroscope allowed limited  visualization of the endometrial cavity.   PROCEDURE:  The patient was taken to the operating room and after  appropriate identification, placed on the operating table.  After the  attainment of adequate general anesthesia, she was placed in the lithotomy  position.  The perineum and vagina were prepped in multiple layers of  Betadine and draped in a sterile field.  A Graves speculum was placed in the  vagina and a paracervical block achieved with a total of 10 mL of 2%  Xylocaine in the five and 7 o'clock positions.  The uterus was sounded to 8  cm.  The cervix was dilated to a #25 dilator, and the diagnostic  hysteroscope was inserted with visualization of some fluffiness of the  endometrium, but no specific endometrial lesion was noted.  The hysteroscope  was then removed, and curettage of all quadrants of the uterus was  undertaken with a small amount of endometrial curettings obtained.  Upon re-  placement of the hysteroscope in the endometrial cavity, the camera was  noted to allow only limited visualization.  At that time, the procedure was  terminated and the instruments removed from the vagina.  The patient was  then awakened from general anesthesia and taken to the recovery room in  satisfactory condition  having tolerated the procedure well with sponge and  instrument counts correct.   SPECIMENS TO PATHOLOGY:  Endometrial curettings.      Hal Morales, M.D.  Electronically Signed     VPH/MEDQ  D:  01/03/2005  T:  01/03/2005  Job:  [id number]

## 2010-08-16 NOTE — Op Note (Signed)
NAMESOLANGEL, MCMANAWAY                 ACCOUNT NO.:  [id number]   MEDICAL RECORD NO.:  [id number]          PATIENT TYPE:  AMB   LOCATION:  ENDO                         FACILITY:  [facility]   PHYSICIAN:  Anselmo Rod, M.D.  DATE OF BIRTH:  1945-08-31   DATE OF PROCEDURE:  10/13/2005  DATE OF DISCHARGE:                                 OPERATIVE REPORT   PROCEDURE PERFORMED:  Colonoscopy with cold biopsy X 1.   ENDOSCOPIST:  Anselmo Rod, M.D.   INSTRUMENT:  Olympus video colonoscope.   INDICATIONS:  A 65 year old white female with a history of rectal bleeding,  underwent screening colonoscopy to rule out colonic polyps, masses, etc.   PREPROCEDURE PREPARATION:  Informed consent was secured from the patient.  The patient had fasted for 4 hours prior to the procedure and prepped with  32 Osmoprep pills the night of and the morning of the procedure.  Risks and  benefits of the procedure including a 10% risk rate of cancer and polyp were  discussed with the patient as well.   PREPROCEDURE PHYSICAL:  Vital signs:  The patient had stable vital signs.  Neck:  Supple.  Chest:  Clear to auscultation. S1 and S2 regular.  Abdomen:  Obese, nontender with normal bowel sounds.   DESCRIPTION OF PROCEDURE:  The patient is placed in the left lateral  decubitus position and sedated with 125 mcg of fentanyl and 12 mg of Versed  in slow incremental doses.  Once the patient was adequately sedated and  maintenance on low-flow oxygen and continuous cardiac monitoring, the  Olympus video colonoscope was advanced from the rectum to the cecum.  There  was evidence of pan diverticulosis with large diverticula throughout the  colon.  More problem changes were seen in the sigmoid colon.  Small internal  hemorrhoids were seen on retroflexion. A small external hemorrhoid was seen  in inspection. A small sessile polyp was biopsied at 100 cm.  The terminal  ileum appeared normal.  The patient tolerated the  procedure well without  complications.  The appendiceal orifice and ileocecal valve were clearly  visualized and photographed.   IMPRESSION:  1.Small external hemorrhoid.  2.Small internal hemorrhoid.  3.Small sessile polyp biopsied at 100 cm (cold biopsy times 1).  4.Pandiverticulosis with more problem changes in the sigmoid colon.  5.Normal terminal ileum   RECOMMENDATIONS:  1.Await pathology results.  2.Respect colonoscopy depending upon pathology results.  3.Avoid all nonsteroidals including aspirin for the next 2 weeks.  4.Brochures on diverticulosis along with a high-fiber diet have been given  to the patient for education.  5.Outpatient followup as needed or arises in the future.      Anselmo Rod, M.D.  Electronically Signed     JNM/MEDQ  D:  10/13/2005  T:  10/14/2005  Job:  [id number]   cc:   Hal Morales, M.D.  Fax: [phone]   Cam Hai, C.N.M.  Fax: [phone]

## 2010-12-26 LAB — CBC
HCT: 37.6
HCT: 45.2
Hemoglobin: 13.2
Hemoglobin: 15.6 — ABNORMAL HIGH
MCHC: 34.5
MCHC: 35.2
MCV: 88.5
MCV: 89.1
Platelets: 276
Platelets: 313
RBC: 4.24
RBC: 5.07
RDW: 12.9
RDW: 13.2
WBC: 10.1
WBC: 14.9 — ABNORMAL HIGH

## 2010-12-26 LAB — BASIC METABOLIC PANEL
BUN: 9
CO2: 28
Calcium: 10.5
Chloride: 107
Creatinine, Ser: 0.71
GFR calc Af Amer: >60
GFR calc non Af Amer: >60
Glucose, Bld: 97
Potassium: 4.7
Sodium: 142

## 2011-01-08 LAB — CBC
HCT: 43.6
Hemoglobin: 15.2 — ABNORMAL HIGH
MCHC: 34.8
MCV: 88.4
Platelets: 299
RBC: 4.93
RDW: 13.2
WBC: 7.7

## 2011-01-08 LAB — VITAMIN D 1,25 DIHYDROXY: Vit D, 1,25-Dihydroxy: 57 pg/mL (ref 6–62)

## 2011-09-05 ENCOUNTER — Ambulatory Visit (INDEPENDENT_AMBULATORY_CARE_PROVIDER_SITE_OTHER): Payer: Medicare Other | Admitting: Internal Medicine

## 2011-09-05 VITALS — BP 122/76 | HR 101 | Temp 98.2°F | Resp 18 | Ht 60.25 in | Wt 217.2 lb

## 2011-09-05 DIAGNOSIS — H9201 Otalgia, right ear: Secondary | ICD-10-CM

## 2011-09-05 DIAGNOSIS — H6690 Otitis media, unspecified, unspecified ear: Secondary | ICD-10-CM

## 2011-09-05 DIAGNOSIS — H6090 Unspecified otitis externa, unspecified ear: Secondary | ICD-10-CM

## 2011-09-05 DIAGNOSIS — H60399 Other infective otitis externa, unspecified ear: Secondary | ICD-10-CM

## 2011-09-05 MED ORDER — ANTIPYRINE-BENZOCAINE 5.4-1.4 % OT SOLN: 3.0000 [drp] | Freq: Four times a day (QID) | OTIC | Status: AC | PRN

## 2011-09-05 MED ORDER — TRAMADOL HCL 50 MG PO TABS: 50.0000 mg | ORAL_TABLET | Freq: Three times a day (TID) | ORAL | Status: AC | PRN

## 2011-09-05 MED ORDER — AMOXICILLIN 500 MG PO CAPS: 500.0000 mg | ORAL_CAPSULE | Freq: Three times a day (TID) | ORAL | Status: AC

## 2011-09-05 MED ORDER — CIPROFLOXACIN-HYDROCORTISONE 0.2-1 % OT SUSP: 3.0000 [drp] | Freq: Two times a day (BID) | OTIC | Status: AC

## 2011-09-05 NOTE — Patient Instructions (Signed)
Eardrops as directed. Pain meds and antibiotics as directed.

## 2011-09-05 NOTE — Progress Notes (Signed)
  Subjective:    Patient ID: [name], female    DOB: Dec 28, 1945, 66 y.o.   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   HENT: Positive for ear discharge.   Eyes: Negative.   Respiratory: Negative.   Cardiovascular: Negative.   Gastrointestinal: Negative.   Genitourinary: Negative.   Musculoskeletal: Negative.   Skin: Negative.   Neurological: Negative.   Hematological: Negative.   Psychiatric/Behavioral: Negative.   All other systems reviewed and are negative.       Objective:   Physical Exam  Nursing note and vitals reviewed. Constitutional: She is oriented to person, place, and time. She appears well-developed and well-nourished.  HENT:  Head: Normocephalic and atraumatic.  Left Ear: External ear normal.  Nose: Nose normal.  Mouth/Throat: Oropharynx is clear and moist.       r otitis externa and media  Eyes: Conjunctivae and EOM are normal. Pupils are equal, round, and reactive to light.  Neck: Normal range of motion. Neck supple.  Cardiovascular: Normal rate, regular rhythm and normal heart sounds.   Pulmonary/Chest: Effort normal and breath sounds normal.  Abdominal: Soft. Bowel sounds are normal.  Musculoskeletal: Normal range of motion.  Neurological: She is alert and oriented to person, place, and time. She has normal reflexes.  Skin: Skin is warm and dry.  Psychiatric: She has a normal mood and affect. Her behavior is normal. Judgment and thought content normal.          Assessment & Plan:  Will rx with analgeisics both local and systemic and antibiotics systemic and local.

## 2014-12-28 ENCOUNTER — Other Ambulatory Visit: Payer: Self-pay | Admitting: Internal Medicine

## 2014-12-28 ENCOUNTER — Ambulatory Visit
Admission: RE | Admit: 2014-12-28 | Discharge: 2014-12-28 | Disposition: A | Discharge status: Home / Self Care | Payer: Medicare Other | Source: Ambulatory Visit | Attending: Internal Medicine | Admitting: Internal Medicine

## 2014-12-28 DIAGNOSIS — R05 Cough: Secondary | ICD-10-CM

## 2014-12-28 DIAGNOSIS — R059 Cough, unspecified: Secondary | ICD-10-CM

## 2014-12-28 DIAGNOSIS — J40 Bronchitis, not specified as acute or chronic: Secondary | ICD-10-CM

## 2015-04-18 ENCOUNTER — Other Ambulatory Visit: Payer: Self-pay | Admitting: Internal Medicine

## 2015-04-18 DIAGNOSIS — E2839 Other primary ovarian failure: Secondary | ICD-10-CM

## 2015-05-30 ENCOUNTER — Other Ambulatory Visit: Payer: Medicare Other

## 2016-04-24 ENCOUNTER — Ambulatory Visit
Admission: RE | Admit: 2016-04-24 | Discharge: 2016-04-24 | Disposition: A | Discharge status: Home / Self Care | Payer: Medicare Other | Source: Ambulatory Visit | Attending: Internal Medicine | Admitting: Internal Medicine

## 2016-04-24 ENCOUNTER — Other Ambulatory Visit: Payer: Self-pay | Admitting: Internal Medicine

## 2016-04-24 DIAGNOSIS — R05 Cough: Secondary | ICD-10-CM

## 2016-04-24 DIAGNOSIS — R059 Cough, unspecified: Secondary | ICD-10-CM

## 2016-04-24 DIAGNOSIS — R062 Wheezing: Secondary | ICD-10-CM

## 2019-06-17 ENCOUNTER — Other Ambulatory Visit: Payer: Self-pay | Admitting: Internal Medicine

## 2019-06-17 ENCOUNTER — Ambulatory Visit
Admission: RE | Admit: 2019-06-17 | Discharge: 2019-06-17 | Disposition: A | Discharge status: Home / Self Care | Payer: Medicare Other | Source: Ambulatory Visit | Attending: Internal Medicine | Admitting: Internal Medicine

## 2019-06-17 ENCOUNTER — Other Ambulatory Visit: Payer: Self-pay

## 2019-06-17 DIAGNOSIS — R0602 Shortness of breath: Secondary | ICD-10-CM

## 2019-11-02 ENCOUNTER — Encounter (INDEPENDENT_AMBULATORY_CARE_PROVIDER_SITE_OTHER): Payer: Self-pay | Admitting: Family Medicine

## 2019-11-02 ENCOUNTER — Other Ambulatory Visit: Payer: Self-pay

## 2019-11-02 ENCOUNTER — Ambulatory Visit (INDEPENDENT_AMBULATORY_CARE_PROVIDER_SITE_OTHER): Payer: Medicare PPO | Admitting: Family Medicine

## 2019-11-02 VITALS — BP 138/81 | HR 110 | Temp 98.1°F | Ht 60.0 in | Wt 242.0 lb

## 2019-11-02 DIAGNOSIS — E038 Other specified hypothyroidism: Secondary | ICD-10-CM | POA: Diagnosis not present

## 2019-11-02 DIAGNOSIS — Z1331 Encounter for screening for depression: Secondary | ICD-10-CM

## 2019-11-02 DIAGNOSIS — E559 Vitamin D deficiency, unspecified: Secondary | ICD-10-CM

## 2019-11-02 DIAGNOSIS — R0602 Shortness of breath: Secondary | ICD-10-CM

## 2019-11-02 DIAGNOSIS — R5383 Other fatigue: Secondary | ICD-10-CM

## 2019-11-02 DIAGNOSIS — Z6841 Body Mass Index (BMI) 40.0 and over, adult: Secondary | ICD-10-CM

## 2019-11-02 DIAGNOSIS — Z0289 Encounter for other administrative examinations: Secondary | ICD-10-CM

## 2019-11-02 DIAGNOSIS — R739 Hyperglycemia, unspecified: Secondary | ICD-10-CM

## 2019-11-02 DIAGNOSIS — I10 Essential (primary) hypertension: Secondary | ICD-10-CM

## 2019-11-03 LAB — CBC WITH DIFFERENTIAL/PLATELET
Basophils Absolute: 0.2 10*3/uL (ref 0.0–0.2)
Basos: 2 %
EOS (ABSOLUTE): 0.4 10*3/uL (ref 0.0–0.4)
Eos: 4 %
Hematocrit: 45.4 % (ref 34.0–46.6)
Hemoglobin: 15.6 g/dL (ref 11.1–15.9)
Immature Grans (Abs): 0 10*3/uL (ref 0.0–0.1)
Immature Granulocytes: 0 %
Lymphocytes Absolute: 2.9 10*3/uL (ref 0.7–3.1)
Lymphs: 30 %
MCH: 30.6 pg (ref 26.6–33.0)
MCHC: 34.4 g/dL (ref 31.5–35.7)
MCV: 89 fL (ref 79–97)
Monocytes Absolute: 0.6 10*3/uL (ref 0.1–0.9)
Monocytes: 6 %
Neutrophils Absolute: 5.6 10*3/uL (ref 1.4–7.0)
Neutrophils: 58 %
Platelets: 368 10*3/uL (ref 150–450)
RBC: 5.1 x10E6/uL (ref 3.77–5.28)
RDW: 13.2 % (ref 11.7–15.4)
WBC: 9.7 10*3/uL (ref 3.4–10.8)

## 2019-11-03 LAB — COMPREHENSIVE METABOLIC PANEL
ALT: 23 IU/L (ref 0–32)
AST: 17 IU/L (ref 0–40)
Albumin/Globulin Ratio: 2.3 — ABNORMAL HIGH (ref 1.2–2.2)
Albumin: 4.8 g/dL — ABNORMAL HIGH (ref 3.7–4.7)
Alkaline Phosphatase: 142 IU/L — ABNORMAL HIGH (ref 48–121)
BUN/Creatinine Ratio: 16 (ref 12–28)
BUN: 13 mg/dL (ref 8–27)
Bilirubin Total: 1 mg/dL (ref 0.0–1.2)
CO2: 23 mmol/L (ref 20–29)
Calcium: 10.1 mg/dL (ref 8.7–10.3)
Chloride: 100 mmol/L (ref 96–106)
Creatinine, Ser: 0.83 mg/dL (ref 0.57–1.00)
GFR calc Af Amer: 80 mL/min/{1.73_m2} (ref >59)
GFR calc non Af Amer: 70 mL/min/{1.73_m2} (ref >59)
Globulin, Total: 2.1 g/dL (ref 1.5–4.5)
Glucose: 101 mg/dL — ABNORMAL HIGH (ref 65–99)
Potassium: 4.4 mmol/L (ref 3.5–5.2)
Sodium: 139 mmol/L (ref 134–144)
Total Protein: 6.9 g/dL (ref 6.0–8.5)

## 2019-11-03 LAB — HEMOGLOBIN A1C
Est. average glucose Bld gHb Est-mCnc: 120 mg/dL
Hgb A1c MFr Bld: 5.8 % — ABNORMAL HIGH (ref 4.8–5.6)

## 2019-11-03 LAB — LIPID PANEL WITH LDL/HDL RATIO
Cholesterol, Total: 243 mg/dL — ABNORMAL HIGH (ref 100–199)
HDL: 95 mg/dL (ref >39)
LDL Chol Calc (NIH): 131 mg/dL — ABNORMAL HIGH (ref 0–99)
LDL/HDL Ratio: 1.4 ratio (ref 0.0–3.2)
Triglycerides: 99 mg/dL (ref 0–149)
VLDL Cholesterol Cal: 17 mg/dL (ref 5–40)

## 2019-11-03 LAB — TSH: TSH: 2.61 u[IU]/mL (ref 0.450–4.500)

## 2019-11-03 LAB — VITAMIN D 25 HYDROXY (VIT D DEFICIENCY, FRACTURES): Vit D, 25-Hydroxy: 78.9 ng/mL (ref 30.0–100.0)

## 2019-11-03 LAB — T3: T3, Total: 121 ng/dL (ref 71–180)

## 2019-11-03 LAB — T4: T4, Total: 10.9 ug/dL (ref 4.5–12.0)

## 2019-11-03 LAB — INSULIN, RANDOM: INSULIN: 33 u[IU]/mL — ABNORMAL HIGH (ref 2.6–24.9)

## 2019-11-07 NOTE — Progress Notes (Signed)
Dear Dr. Luiz Blare,   Thank you for referring [name] to our clinic. The following note includes my evaluation and treatment recommendations.  Chief Complaint:   OBESITY [name] (MR# [id number]) is a 74 y.o. female who presents for evaluation and treatment of obesity and related comorbidities. Current BMI is Body mass index is 47.26 kg/m. [name] has been struggling with her weight for many years and has been unsuccessful in either losing weight, maintaining weight loss, or reaching her healthy weight goal.  [name] is on phentermine (for a couple of years). She is eating 2 times per day. Toast and egg or a scoop of cottage cheese with fruit (netarine or grapes) with almonds or pecans. Dinner is meat and vegetables (salad 1/2 bag) and 2 slices of roast beef. She may have 1 small cup of ice cream.  [name] is currently in the action stage of change and ready to dedicate time achieving and maintaining a healthier weight. [name] is interested in becoming our patient and working on intensive lifestyle modifications including (but not limited to) diet and exercise for weight loss.  [name]'s habits were reviewed today and are as follows: her desired weight loss is 52 lbs, she has been heavy most of her life, she started gaining weight during puberty, her heaviest weight ever was 242 pounds, she has significant food cravings issues, she snacks frequently in the evenings, she skips meals frequently and she struggles with emotional eating.  Depression Screen [name]'s Food and Mood (modified PHQ-9) score was 7.  Depression screen PHQ 2/9 11/02/2019  Decreased Interest 1  Down, Depressed, Hopeless 1  PHQ - 2 Score 2  Altered sleeping 3  Tired, decreased energy 1  Change in appetite 1  Feeling bad or failure about yourself  0  Trouble concentrating 0  Moving slowly or fidgety/restless 0  Suicidal thoughts 0  PHQ-9 Score 7  Difficult doing work/chores Not difficult at all   Subjective:    1. Other fatigue [name] admits to daytime somnolence and admits to waking up still tired. Patent has a history of symptoms of daytime fatigue. [name] generally gets 8 hours of sleep per night, and states that she has difficulty falling asleep. Snoring is present. Apneic episodes are not present. Epworth Sleepiness Score is 3. EKG-sinus tachycardia at 110 BPM (no previous EKG).  2. SOB (shortness of breath) on exertion [name] notes increasing shortness of breath with exercising and seems to be worsening over time with weight gain. She notes getting out of breath sooner with activity than she used to. This has not gotten worse recently. [name] denies shortness of breath at rest or orthopnea.  3. Vitamin D deficiency [name] denies nausea, vomiting, or muscle weakness, but she notes fatigue. She is on OTC Vit D 5,000 IU daily.  4. Other specified hypothyroidism [name] is on levothyroxine, and she denies cold/heat intolerance or palpitations.  5. Essential hypertension [name]'s blood pressure is well controlled today, and she denies chest pain, chest pressure, or headaches. She is on amlodipine 10 mg.  6. Hyperglycemia [name] has a history of elevated blood sugars.  Assessment/Plan:   1. Other fatigue [name] does feel that her weight is causing her energy to be lower than it should be. Fatigue may be related to obesity, depression or many other causes. Labs will be ordered, and in the meanwhile, [name] will focus on self care including making healthy food choices, increasing physical activity and focusing on stress reduction.  - EKG 12-Lead -  CBC with Differential/Platelet  2. SOB (shortness of breath) on exertion [name] does feel that she gets out of breath more easily that she used to when she exercises. [name]'s shortness of breath appears to be obesity related and exercise induced. She has agreed to work on weight loss and gradually increase exercise to treat her exercise induced  shortness of breath. Will continue to monitor closely.  - CBC with Differential/Platelet  3. Vitamin D deficiency Low Vitamin D level contributes to fatigue and are associated with obesity, breast, and colon cancer. We will check labs today. [name] will follow-up for routine testing of Vitamin D, at least 2-3 times per year to avoid over-replacement.  - VITAMIN D 25 Hydroxy (Vit-D Deficiency, Fractures)  4. Other specified hypothyroidism Patient with long-standing hypothyroidism, on levothyroxine therapy. [name] appears euthyroid. We will check labs today. Orders and follow up as documented in patient record.  Counseling . Good thyroid control is important for overall health. Supratherapeutic thyroid levels are dangerous and will not improve weight loss results. . The correct way to take levothyroxine is fasting, with water, separated by at least 30 minutes from breakfast, and separated by more than 4 hours from calcium, iron, multivitamins, acid reflux medications (PPIs).   - T3 - T4 - TSH  5. Essential hypertension [name] is working on healthy weight loss and exercise to improve blood pressure control. We will watch for signs of hypotension as she continues her lifestyle modifications. We will check labs today.  - Comprehensive metabolic panel - Lipid Panel With LDL/HDL Ratio  6. Hyperglycemia Fasting labs will be obtained today, and results with be discussed with [name] in 2 weeks at her follow up visit. In the meanwhile [name] was started on a lower simple carbohydrate diet and will work on weight loss efforts.  - Hemoglobin A1c - Insulin, random  7. Depression screening [name] had a positive depression screening. Depression is commonly associated with obesity and often results in emotional eating behaviors. We will monitor this closely and work on CBT to help improve the non-hunger eating patterns. Referral to Psychology may be required if no improvement is seen as she continues  in our clinic.  8. Class 3 severe obesity with serious comorbidity and body mass index (BMI) of 45.0 to 49.9 in adult, unspecified obesity type (HCC) [name] is currently in the action stage of change and her goal is to continue with weight loss efforts. I recommend [name] begin the structured treatment plan as follows:  She has agreed to the Category 2 Plan.  Exercise goals: No exercise has been prescribed at this time.   Behavioral modification strategies: increasing lean protein intake, increasing vegetables, meal planning and cooking strategies, keeping healthy foods in the home and planning for success.  She was informed of the importance of frequent follow-up visits to maximize her success with intensive lifestyle modifications for her multiple health conditions. She was informed we would discuss her lab results at her next visit unless there is a critical issue that needs to be addressed sooner. [name] agreed to keep her next visit at the agreed upon time to discuss these results.  Objective:   Blood pressure 138/81, pulse (!) 110, temperature 98.1 F (36.7 C), temperature source Oral, height 5' (1.524 m), weight 242 lb (109.8 kg), SpO2 97 %. Body mass index is 47.26 kg/m.  EKG: Normal sinus rhythm, rate 112 BPM.  Indirect Calorimeter completed today shows a VO2 of 208 and a REE of 1449.  Her calculated basal  metabolic rate is 9381 thus her basal metabolic rate is worse than expected.  General: Cooperative, alert, well developed, in no acute distress. HEENT: Conjunctivae and lids unremarkable. Cardiovascular: Regular rhythm.  Lungs: Normal work of breathing. Neurologic: No focal deficits.   Lab Results  Component Value Date   CREATININE 0.83 11/02/2019   BUN 13 11/02/2019   NA 139 11/02/2019   K 4.4 11/02/2019   CL 100 11/02/2019   CO2 23 11/02/2019   Lab Results  Component Value Date   ALT 23 11/02/2019   AST 17 11/02/2019   ALKPHOS 142 (H) 11/02/2019   BILITOT 1.0  11/02/2019   Lab Results  Component Value Date   HGBA1C 5.8 (H) 11/02/2019   Lab Results  Component Value Date   INSULIN 33.0 (H) 11/02/2019   Lab Results  Component Value Date   TSH 2.610 11/02/2019   Lab Results  Component Value Date   CHOL 243 (H) 11/02/2019   HDL 95 11/02/2019   LDLCALC 131 (H) 11/02/2019   TRIG 99 11/02/2019   Lab Results  Component Value Date   WBC 9.7 11/02/2019   HGB 15.6 11/02/2019   HCT 45.4 11/02/2019   MCV 89 11/02/2019   PLT 368 11/02/2019   No results found for: IRON, TIBC, FERRITIN Obesity Behavioral Intervention Visit Documentation for Insurance:   Approximately 15 minutes were spent on the discussion below.  ASK: We discussed the diagnosis of obesity with Amilliana today and [name] agreed to give [name] permission to discuss obesity behavioral modification therapy today.  ASSESS: Santasia has the diagnosis of obesity and her BMI today is 47.26. Norva is in the action stage of change.   ADVISE: Chinenye was educated on the multiple health risks of obesity as well as the benefit of weight loss to improve her health. She was advised of the need for long term treatment and the importance of lifestyle modifications to improve her current health and to decrease her risk of future health problems.  AGREE: Multiple dietary modification options and treatment options were discussed and Safiatou agreed to follow the recommendations documented in the above note.  ARRANGE: Estephani was educated on the importance of frequent visits to treat obesity as outlined per CMS and USPSTF guidelines and agreed to schedule her next follow up appointment today.  Attestation Statements:   This is the patient's first visit at [facility]. The patient's NEW PATIENT PACKET was reviewed at length. Included in the packet: current and past health history, medications, allergies, ROS, gynecologic history (women only), surgical history, family history, social  history, weight history, weight loss surgery history (for those that have had weight loss surgery), nutritional evaluation, mood and food questionnaire, PHQ9, Epworth questionnaire, sleep habits questionnaire, patient life and health improvement goals questionnaire. These will all be scanned into the patient's chart under media.   During the visit, I independently reviewed the patient's EKG, bioimpedance scale results, and indirect calorimeter results. I used this information to tailor a meal plan for the patient that will help her to lose weight and will improve her obesity-related conditions going forward. I performed a medically necessary appropriate examination and/or evaluation. I discussed the assessment and treatment plan with the patient. The patient was provided an opportunity to ask questions and all were answered. The patient agreed with the plan and demonstrated an understanding of the instructions. Labs were ordered at this visit and will be reviewed at the next visit unless more critical results need to be addressed  immediately. Clinical information was updated and documented in the EMR.   Time spent on visit including pre-visit chart review and post-visit care was 45 minutes.   A separate 15 minutes was spent on risk counseling (see above).   I, Burt KnackSharon Martin, am acting as transcriptionist for Reuben LikesAlexandria Neeka Urista, MD.  I have reviewed the above documentation for accuracy and completeness, and I agree with the above. - Katherina MiresAlexandria Birney Belshe Kadolph, MD

## 2019-11-16 ENCOUNTER — Other Ambulatory Visit: Payer: Self-pay

## 2019-11-16 ENCOUNTER — Encounter (INDEPENDENT_AMBULATORY_CARE_PROVIDER_SITE_OTHER): Payer: Self-pay | Admitting: Family Medicine

## 2019-11-16 ENCOUNTER — Ambulatory Visit (INDEPENDENT_AMBULATORY_CARE_PROVIDER_SITE_OTHER): Payer: Medicare Other | Admitting: Family Medicine

## 2019-11-16 VITALS — BP 132/78 | HR 121 | Temp 98.1°F | Ht 60.0 in | Wt 237.0 lb

## 2019-11-16 DIAGNOSIS — R7303 Prediabetes: Secondary | ICD-10-CM

## 2019-11-16 DIAGNOSIS — E7849 Other hyperlipidemia: Secondary | ICD-10-CM | POA: Diagnosis not present

## 2019-11-16 DIAGNOSIS — Z6841 Body Mass Index (BMI) 40.0 and over, adult: Secondary | ICD-10-CM

## 2019-11-16 DIAGNOSIS — E559 Vitamin D deficiency, unspecified: Secondary | ICD-10-CM

## 2019-11-17 NOTE — Progress Notes (Signed)
Chief Complaint:   OBESITY [name] is here to discuss her progress with her obesity treatment plan along with follow-up of her obesity related diagnoses. [name] is on the Category 2 Plan and states she is following her eating plan approximately 95% of the time. [name] states she is doing 0 minutes 0 times per week.  Today's visit was #: 2 Starting weight: 242 lbs Starting date: 11/02/2019 Today's weight: 237 lbs Today's date: 11/16/2019 Total lbs lost to date: 5 Total lbs lost since last in-office visit: 5  Interim History: [name] feels that she is eating to eat instead of eating to enjoy. She feels that the meat is tasteless and not enjoyable. She voices not using butter has been a big change for her. Snacks-coffee creamer, skinny pop. She went out to lunch once. She is often making substitutions for sandwich with cottage cheese. She voices this meal plan is to lose weight necessarily for joint replacement surgery.  Subjective:   1. Other hyperlipidemia [name]'s last LDL was 131, HDL 95, triglycerides 99, and total cholesterol of 243. She is not on statin. Her 10 year ASCVD risk score is 15.5%. Modified intensity statin suggested. I discussed labs with the patient today.  2. Pre-diabetes [name]'s last A1c was 5.8 and insulin 33.0, and she is not on medications. I discussed labs with the patient today.  3. Vitamin D deficiency [name]'s last Vit D level was 78.9. She is on 10,000 IU daily OTC Vit D. I discussed labs with the patient today.  Assessment/Plan:   1. Other hyperlipidemia Cardiovascular risk and specific lipid/LDL goals reviewed. We discussed several lifestyle modifications today. Briarrose defers medications at this time; we will repeat labs in 3 months. [name] will continue to work on diet, exercise and weight loss efforts. Orders and follow up as documented in patient record.   Counseling Intensive lifestyle modifications are the first line treatment for this  issue. . Dietary changes: Increase soluble fiber. Decrease simple carbohydrates. . Exercise changes: Moderate to vigorous-intensity aerobic activity 150 minutes per week if tolerated. . Lipid-lowering medications: see documented in medical record.  2. Pre-diabetes [name] will continue to work on weight loss, exercise, and decreasing simple carbohydrates to help decrease the risk of diabetes. We will repeat labs in 3 months.   3. Vitamin D deficiency Low Vitamin D level contributes to fatigue and are associated with obesity, breast, and colon cancer. We will repeat labs in 3 months. [name] will follow-up for routine testing of Vitamin D, at least 2-3 times per year to avoid over-replacement.  4. Class 3 severe obesity with serious comorbidity and body mass index (BMI) of 45.0 to 49.9 in adult, unspecified obesity type (HCC) [name] is currently in the action stage of change. As such, her goal is to continue with weight loss efforts. She has agreed to the Category 2 Plan with cottage cheese substitute for lunch.   Exercise goals: No exercise has been prescribed at this time.  Behavioral modification strategies: increasing lean protein intake, meal planning and cooking strategies, keeping healthy foods in the home and planning for success.  [name] has agreed to follow-up with our clinic in 2 weeks. She was informed of the importance of frequent follow-up visits to maximize her success with intensive lifestyle modifications for her multiple health conditions.   Objective:   Blood pressure 132/78, pulse (!) 121, temperature 98.1 F (36.7 C), temperature source Oral, height 5' (1.524 m), weight 237 lb (107.5 kg), SpO2 93 %. Body mass  index is 46.29 kg/m.  General: Cooperative, alert, well developed, in no acute distress. HEENT: Conjunctivae and lids unremarkable. Cardiovascular: Regular rhythm.  Lungs: Normal work of breathing. Neurologic: No focal deficits.   Lab Results  Component Value  Date   CREATININE 0.83 11/02/2019   BUN 13 11/02/2019   NA 139 11/02/2019   K 4.4 11/02/2019   CL 100 11/02/2019   CO2 23 11/02/2019   Lab Results  Component Value Date   ALT 23 11/02/2019   AST 17 11/02/2019   ALKPHOS 142 (H) 11/02/2019   BILITOT 1.0 11/02/2019   Lab Results  Component Value Date   HGBA1C 5.8 (H) 11/02/2019   Lab Results  Component Value Date   INSULIN 33.0 (H) 11/02/2019   Lab Results  Component Value Date   TSH 2.610 11/02/2019   Lab Results  Component Value Date   CHOL 243 (H) 11/02/2019   HDL 95 11/02/2019   LDLCALC 131 (H) 11/02/2019   TRIG 99 11/02/2019   Lab Results  Component Value Date   WBC 9.7 11/02/2019   HGB 15.6 11/02/2019   HCT 45.4 11/02/2019   MCV 89 11/02/2019   PLT 368 11/02/2019   No results found for: IRON, TIBC, FERRITIN  Attestation Statements:   Reviewed by clinician on day of visit: allergies, medications, problem list, medical history, surgical history, family history, social history, and previous encounter notes.  Time spent on visit including pre-visit chart review and post-visit care and charting was 30 minutes.    I, Burt Knack, am acting as transcriptionist for Reuben Likes, MD.  I have reviewed the above documentation for accuracy and completeness, and I agree with the above. - Katherina Mires, MD

## 2019-12-01 ENCOUNTER — Encounter (INDEPENDENT_AMBULATORY_CARE_PROVIDER_SITE_OTHER): Payer: Self-pay | Admitting: Family Medicine

## 2019-12-01 ENCOUNTER — Ambulatory Visit (INDEPENDENT_AMBULATORY_CARE_PROVIDER_SITE_OTHER): Payer: Medicare PPO | Admitting: Family Medicine

## 2019-12-01 ENCOUNTER — Other Ambulatory Visit: Payer: Self-pay

## 2019-12-01 VITALS — BP 162/86 | HR 120 | Temp 98.0°F | Ht 60.0 in | Wt 233.0 lb

## 2019-12-01 DIAGNOSIS — I10 Essential (primary) hypertension: Secondary | ICD-10-CM | POA: Diagnosis not present

## 2019-12-01 DIAGNOSIS — R000 Tachycardia, unspecified: Secondary | ICD-10-CM | POA: Diagnosis not present

## 2019-12-01 DIAGNOSIS — Z6841 Body Mass Index (BMI) 40.0 and over, adult: Secondary | ICD-10-CM

## 2019-12-06 NOTE — Progress Notes (Signed)
Chief Complaint:   OBESITY [name] is here to discuss her progress with her obesity treatment plan along with follow-up of her obesity related diagnoses. Channel is on the Category 2 Plan with cottage cheese substitute for lunch and states she is following her eating plan approximately 100% of the time. [name] states she is doing 0 minutes 0 times per week.  Today's visit was #: 3 Starting weight: 242 lbs Starting date: 11/02/2019 Today's weight: 233 lbs Today's date: 12/01/2019 Total lbs lost to date: 9 Total lbs lost since last in-office visit: 4  Interim History: [name] has been eating a healthy choice popsicle the last few nights as a snack and she has been also doing popcorn. She changed her cottage cheese to no fat to eat nuts. She denies hunger and she has not been weighing her meat.  Subjective:   1. Essential hypertension [name]'s blood pressure is slightly elevated today. She denies chest pain, chest pressure, or headaches. Her blood pressure was previously controlled.  2. Tachycardia [name]'d heart rate is of 120, previously elevated. She voices she has seen Cardiology previously for this but her Cardiologist wanted to put her on medications and her primary care physician has evaluated  This.  Assessment/Plan:   1. Essential hypertension [name] is working on healthy weight loss and exercise to improve blood pressure control. We will watch for signs of hypotension as she continues her lifestyle modifications. [name] will continue amlodipine with no change in dosage.  2. Tachycardia [name] is to follow up with her primary care physician on 12/19/2019 and at her next appointment.  3. Class 3 severe obesity with serious comorbidity and body mass index (BMI) of 45.0 to 49.9 in adult, unspecified obesity type (HCC) [name] is currently in the action stage of change. As such, her goal is to continue with weight loss efforts. She has agreed to the Category 2 Plan with cottage  cheese substitute for lunch.   Exercise goals: All adults should avoid inactivity. Some physical activity is better than none, and adults who participate in any amount of physical activity gain some health benefits.  Behavioral modification strategies: increasing lean protein intake, meal planning and cooking strategies, keeping healthy foods in the home and planning for success.  [name] has agreed to follow-up with our clinic in 2 weeks. She was informed of the importance of frequent follow-up visits to maximize her success with intensive lifestyle modifications for her multiple health conditions.   Objective:   Blood pressure (!) 162/86, pulse (!) 120, temperature 98 F (36.7 C), temperature source Oral, height 5' (1.524 m), weight 233 lb (105.7 kg), SpO2 95 %. Body mass index is 45.5 kg/m.  General: Cooperative, alert, well developed, in no acute distress. HEENT: Conjunctivae and lids unremarkable. Cardiovascular: Regular rhythm.  Lungs: Normal work of breathing. Neurologic: No focal deficits.   Lab Results  Component Value Date   CREATININE 0.83 11/02/2019   BUN 13 11/02/2019   NA 139 11/02/2019   K 4.4 11/02/2019   CL 100 11/02/2019   CO2 23 11/02/2019   Lab Results  Component Value Date   ALT 23 11/02/2019   AST 17 11/02/2019   ALKPHOS 142 (H) 11/02/2019   BILITOT 1.0 11/02/2019   Lab Results  Component Value Date   HGBA1C 5.8 (H) 11/02/2019   Lab Results  Component Value Date   INSULIN 33.0 (H) 11/02/2019   Lab Results  Component Value Date   TSH 2.610 11/02/2019   Lab Results  Component Value Date   CHOL 243 (H) 11/02/2019   HDL 95 11/02/2019   LDLCALC 131 (H) 11/02/2019   TRIG 99 11/02/2019   Lab Results  Component Value Date   WBC 9.7 11/02/2019   HGB 15.6 11/02/2019   HCT 45.4 11/02/2019   MCV 89 11/02/2019   PLT 368 11/02/2019   No results found for: IRON, TIBC, FERRITIN  Attestation Statements:   Reviewed by clinician on day of visit:  allergies, medications, problem list, medical history, surgical history, family history, social history, and previous encounter notes.  Time spent on visit including pre-visit chart review and post-visit care and charting was 15 minutes.    I, Burt Knack, am acting as transcriptionist for Reuben Likes, MD.  I have reviewed the above documentation for accuracy and completeness, and I agree with the above. - Katherina Mires, MD

## 2019-12-15 ENCOUNTER — Ambulatory Visit (INDEPENDENT_AMBULATORY_CARE_PROVIDER_SITE_OTHER): Payer: Medicare PPO | Admitting: Family Medicine

## 2019-12-15 ENCOUNTER — Other Ambulatory Visit: Payer: Self-pay

## 2019-12-15 ENCOUNTER — Encounter (INDEPENDENT_AMBULATORY_CARE_PROVIDER_SITE_OTHER): Payer: Self-pay | Admitting: Family Medicine

## 2019-12-15 VITALS — BP 131/85 | HR 118 | Temp 98.1°F | Ht 60.0 in | Wt 229.0 lb

## 2019-12-15 DIAGNOSIS — I10 Essential (primary) hypertension: Secondary | ICD-10-CM

## 2019-12-15 DIAGNOSIS — Z6841 Body Mass Index (BMI) 40.0 and over, adult: Secondary | ICD-10-CM | POA: Diagnosis not present

## 2019-12-15 DIAGNOSIS — R7303 Prediabetes: Secondary | ICD-10-CM | POA: Diagnosis not present

## 2019-12-19 NOTE — Progress Notes (Signed)
Chief Complaint:   OBESITY Loral is here to discuss her progress with her obesity treatment plan along with follow-up of her obesity related diagnoses. Harlene is on the Category 2 Plan with cottage cheese substitute for lunch and states she is following her eating plan approximately 95% of the time. Mirinda states she is doing 0 minutes 0 times per week.  Today's visit was #: 4 Starting weight: 242 lbs Starting date: 11/02/2019 Today's weight: 229 lbs Today's date: 12/15/2019 Total lbs lost to date: 13 Total lbs lost since last in-office visit: 4  Interim History: Khadijatou voices she is occasionally hungry. She tries to drink water when hungry and that normally suffices. She is doing cottage cheese, fruit, and a few nuts at least 1 time per day. She doesn't like eating microwave meals so she is doing protein bar. Meat and salad is a typical dinner. She is getting close to 6 oz. Doing skinny popcorn and fudgesicle for snack. She did eat a few tablespoon's of coffee ice cream within last few weeks. Has a busy upcoming weekend with daughters wedding.  Subjective:   1. Essential hypertension Judieth's blood pressure is well controlled. She denies chest pain, chest pressure, or headache. She is on amlodipine.  2. Pre-diabetes Houston's last A1c was 5.8 and insulin 33.0. She is not on medications. She notes occasional carbohydrate cravings for ice cream.  Assessment/Plan:   1. Essential hypertension Raja is working on healthy weight loss and exercise to improve blood pressure control. We will watch for signs of hypotension as she continues her lifestyle modifications. We will follow up on her blood pressure at her next appointment.  2. Pre-diabetes Diannia will continue to work on weight loss, exercise, and decreasing simple carbohydrates to help decrease the risk of diabetes. We will repeat labs in early December.  3. Class 3 severe obesity with serious comorbidity and body mass index  (BMI) of 40.0 to 44.9 in adult, unspecified obesity type (HCC) Cieara is currently in the action stage of change. As such, her goal is to continue with weight loss efforts. She has agreed to the Category 2 Plan.   Exercise goals: No exercise has been prescribed at this time.  Behavioral modification strategies: increasing lean protein intake, no skipping meals, meal planning and cooking strategies, keeping healthy foods in the home and planning for success.  Ranette has agreed to follow-up with our clinic in 2 weeks. She was informed of the importance of frequent follow-up visits to maximize her success with intensive lifestyle modifications for her multiple health conditions.   Objective:   Blood pressure 131/85, pulse (!) 118, temperature 98.1 F (36.7 C), temperature source Oral, height 5' (1.524 m), weight 229 lb (103.9 kg), SpO2 94 %. Body mass index is 44.72 kg/m.  General: Cooperative, alert, well developed, in no acute distress. HEENT: Conjunctivae and lids unremarkable. Cardiovascular: Regular rhythm.  Lungs: Normal work of breathing. Neurologic: No focal deficits.   Lab Results  Component Value Date   CREATININE 0.83 11/02/2019   BUN 13 11/02/2019   NA 139 11/02/2019   K 4.4 11/02/2019   CL 100 11/02/2019   CO2 23 11/02/2019   Lab Results  Component Value Date   ALT 23 11/02/2019   AST 17 11/02/2019   ALKPHOS 142 (H) 11/02/2019   BILITOT 1.0 11/02/2019   Lab Results  Component Value Date   HGBA1C 5.8 (H) 11/02/2019   Lab Results  Component Value Date   INSULIN 33.0 (H)  11/02/2019   Lab Results  Component Value Date   TSH 2.610 11/02/2019   Lab Results  Component Value Date   CHOL 243 (H) 11/02/2019   HDL 95 11/02/2019   LDLCALC 131 (H) 11/02/2019   TRIG 99 11/02/2019   Lab Results  Component Value Date   WBC 9.7 11/02/2019   HGB 15.6 11/02/2019   HCT 45.4 11/02/2019   MCV 89 11/02/2019   PLT 368 11/02/2019   No results found for: IRON, TIBC,  FERRITIN  Attestation Statements:   Reviewed by clinician on day of visit: allergies, medications, problem list, medical history, surgical history, family history, social history, and previous encounter notes.  Time spent on visit including pre-visit chart review and post-visit care and charting was 16 minutes.    I, Burt Knack, am acting as transcriptionist for Reuben Likes, MD.  I have reviewed the above documentation for accuracy and completeness, and I agree with the above. - Katherina Mires, MD

## 2019-12-29 ENCOUNTER — Ambulatory Visit (INDEPENDENT_AMBULATORY_CARE_PROVIDER_SITE_OTHER): Payer: Medicare PPO | Admitting: Family Medicine

## 2020-01-05 ENCOUNTER — Ambulatory Visit (INDEPENDENT_AMBULATORY_CARE_PROVIDER_SITE_OTHER): Payer: Medicare PPO | Admitting: Family Medicine

## 2020-01-05 ENCOUNTER — Other Ambulatory Visit: Payer: Self-pay

## 2020-01-05 ENCOUNTER — Encounter (INDEPENDENT_AMBULATORY_CARE_PROVIDER_SITE_OTHER): Payer: Self-pay | Admitting: Family Medicine

## 2020-01-05 VITALS — BP 117/75 | HR 110 | Temp 97.7°F | Ht 60.0 in | Wt 226.0 lb

## 2020-01-05 DIAGNOSIS — E7849 Other hyperlipidemia: Secondary | ICD-10-CM

## 2020-01-05 DIAGNOSIS — I10 Essential (primary) hypertension: Secondary | ICD-10-CM | POA: Diagnosis not present

## 2020-01-05 DIAGNOSIS — Z6841 Body Mass Index (BMI) 40.0 and over, adult: Secondary | ICD-10-CM | POA: Diagnosis not present

## 2020-01-05 NOTE — Progress Notes (Signed)
Chief Complaint:   OBESITY [name] is here to discuss her progress with her obesity treatment plan along with follow-up of her obesity related diagnoses. [name] is on the Category 2 Plan and states she is following her eating plan approximately 80-90% of the time. [name] states she is doing 0 minutes 0 times per week.  Today's visit was #: 5 Starting weight: 242 lbs Starting date: 11/02/2019 Today's weight: 226 lbs Today's date: 01/05/2020 Total lbs lost to date: 16 Total lbs lost since last in-office visit: 3  Interim History: [name] had a few celebrations during the last few weeks. She made smart choices and controlled indulgences. She was disappointed with delaying her appointment from last week. She did overindulge in carbohydrates last week. She is struggling with last meal of the day (which is breakfast.  Subjective:   1. Essential hypertension [name]'s blood pressure is well controlled today. She is on amlodipine 10 mg. She denies chest pain, chest pressure, or headache.  2. Other hyperlipidemia [name]'s last LDL was 131, HDL 95, and triglycerides 99. She is not on statin.  Assessment/Plan:   1. Essential hypertension [name] is working on healthy weight loss and exercise to improve blood pressure control. We will watch for signs of hypotension as she continues her lifestyle modifications. [name] will continue her current medications, no change at this time.  2. Other hyperlipidemia Cardiovascular risk and specific lipid/LDL goals reviewed. We discussed several lifestyle modifications today and [name] will continue to work on diet, exercise and weight loss efforts. We will repeat labs in early 2022. Orders and follow up as documented in patient record.   Counseling Intensive lifestyle modifications are the first line treatment for this issue. . Dietary changes: Increase soluble fiber. Decrease simple carbohydrates. . Exercise changes: Moderate to vigorous-intensity aerobic  activity 150 minutes per week if tolerated. . Lipid-lowering medications: see documented in medical record.  3. Class 3 severe obesity with serious comorbidity and body mass index (BMI) of 40.0 to 44.9 in adult, unspecified obesity type (HCC) [name] is currently in the action stage of change. As such, her goal is to continue with weight loss efforts. She has agreed to the Category 2 Plan with breakfast options.   Exercise goals: No exercise has been prescribed at this time.  Behavioral modification strategies: increasing lean protein intake, meal planning and cooking strategies, keeping healthy foods in the home and planning for success.  [name] has agreed to follow-up with our clinic in 2 to 3 weeks. She was informed of the importance of frequent follow-up visits to maximize her success with intensive lifestyle modifications for her multiple health conditions.   Objective:   Blood pressure 117/75, pulse (!) 110, temperature 97.7 F (36.5 C), temperature source Oral, height 5' (1.524 m), weight 226 lb (102.5 kg), SpO2 96 %. Body mass index is 44.14 kg/m.  General: Cooperative, alert, well developed, in no acute distress. HEENT: Conjunctivae and lids unremarkable. Cardiovascular: Regular rhythm.  Lungs: Normal work of breathing. Neurologic: No focal deficits.   Lab Results  Component Value Date   CREATININE 0.83 11/02/2019   BUN 13 11/02/2019   NA 139 11/02/2019   K 4.4 11/02/2019   CL 100 11/02/2019   CO2 23 11/02/2019   Lab Results  Component Value Date   ALT 23 11/02/2019   AST 17 11/02/2019   ALKPHOS 142 (H) 11/02/2019   BILITOT 1.0 11/02/2019   Lab Results  Component Value Date   HGBA1C 5.8 (H) 11/02/2019  Lab Results  Component Value Date   INSULIN 33.0 (H) 11/02/2019   Lab Results  Component Value Date   TSH 2.610 11/02/2019   Lab Results  Component Value Date   CHOL 243 (H) 11/02/2019   HDL 95 11/02/2019   LDLCALC 131 (H) 11/02/2019   TRIG 99  11/02/2019   Lab Results  Component Value Date   WBC 9.7 11/02/2019   HGB 15.6 11/02/2019   HCT 45.4 11/02/2019   MCV 89 11/02/2019   PLT 368 11/02/2019   No results found for: IRON, TIBC, FERRITIN  Attestation Statements:   Reviewed by clinician on day of visit: allergies, medications, problem list, medical history, surgical history, family history, social history, and previous encounter notes.  Time spent on visit including pre-visit chart review and post-visit care and charting was 15 minutes.    I, Burt Knack, am acting as transcriptionist for Reuben Likes, MD.  I have reviewed the above documentation for accuracy and completeness, and I agree with the above. - Katherina Mires, MD

## 2020-01-12 ENCOUNTER — Ambulatory Visit (INDEPENDENT_AMBULATORY_CARE_PROVIDER_SITE_OTHER): Payer: Medicare PPO | Admitting: Family Medicine

## 2020-01-25 ENCOUNTER — Ambulatory Visit (INDEPENDENT_AMBULATORY_CARE_PROVIDER_SITE_OTHER): Payer: Medicare PPO | Admitting: Family Medicine

## 2020-01-25 ENCOUNTER — Other Ambulatory Visit: Payer: Self-pay

## 2020-01-25 ENCOUNTER — Encounter (INDEPENDENT_AMBULATORY_CARE_PROVIDER_SITE_OTHER): Payer: Self-pay | Admitting: Family Medicine

## 2020-01-25 VITALS — BP 132/75 | HR 120 | Temp 98.2°F | Ht 60.0 in | Wt 225.0 lb

## 2020-01-25 DIAGNOSIS — R7303 Prediabetes: Secondary | ICD-10-CM

## 2020-01-25 DIAGNOSIS — Z6841 Body Mass Index (BMI) 40.0 and over, adult: Secondary | ICD-10-CM | POA: Diagnosis not present

## 2020-01-25 DIAGNOSIS — E7849 Other hyperlipidemia: Secondary | ICD-10-CM

## 2020-01-26 NOTE — Progress Notes (Signed)
Chief Complaint:   OBESITY [name] is here to discuss her progress with her obesity treatment plan along with follow-up of her obesity related diagnoses. [name] is on the Category 2 Plan and states she is following her eating plan approximately 90% of the time. [name] states she is not exercising.  Today's visit was #: 6 Starting weight: 242 lbs Starting date: 11/02/19 Today's weight: 225 lbs Today's date: 01/26/2020 Total lbs lost to date: 17 Total lbs lost since last in-office visit: 1  Interim History: [name] is frustrated with weight loss. She really wants to get to a BMI of 40 to get her hip replaced. [name] still finds getting all her food in to be difficult. She is often skipping meals and getting about 60 grams of protein per day.  Subjective:   1. Prediabetes [name] has a diagnosis of prediabetes based on her elevated HgA1c and was informed this puts her at greater risk of developing diabetes. She continues to work on diet and exercise to decrease her risk of diabetes. She denies nausea or hypoglycemia. Her A1c is 5.8 and insulin is 33.0. [name] is not on medication.  Lab Results  Component Value Date   HGBA1C 5.8 (H) 11/02/2019   Lab Results  Component Value Date   INSULIN 33.0 (H) 11/02/2019   2. Other hyperlipidemia [name] has hyperlipidemia and has been trying to improve her cholesterol levels with intensive lifestyle modification including a low saturated fat diet, exercise and weight loss. Her LDL is 131, triglycerides are 99, and HDL is 95. [name] is not on a statin. She denies any chest pain, claudication or myalgias.  Lab Results  Component Value Date   ALT 23 11/02/2019   AST 17 11/02/2019   ALKPHOS 142 (H) 11/02/2019   BILITOT 1.0 11/02/2019   Lab Results  Component Value Date   CHOL 243 (H) 11/02/2019   HDL 95 11/02/2019   LDLCALC 131 (H) 11/02/2019   TRIG 99 11/02/2019    Assessment/Plan:   1. Prediabetes [name] will continue to work on  weight loss, exercise, and decreasing simple carbohydrates to help decrease the risk of diabetes. We will revisit metformin at her next appointment.  2. Other hyperlipidemia Cardiovascular risk and specific lipid/LDL goals reviewed.  We discussed several lifestyle modifications today and [name] will continue to work on diet, exercise and weight loss efforts. Orders and follow up as documented in patient record. We will repeat labs in early January 2022.  Counseling Intensive lifestyle modifications are the first line treatment for this issue. . Dietary changes: Increase soluble fiber. Decrease simple carbohydrates. . Exercise changes: Moderate to vigorous-intensity aerobic activity 150 minutes per week if tolerated. . Lipid-lowering medications: see documented in medical record.  3. Class 3 severe obesity with serious comorbidity and body mass index (BMI) of 40.0 to 44.9 in adult, unspecified obesity type (HCC)  [name] is currently in the action stage of change. As such, her goal is to continue with weight loss efforts. She has agreed to keeping a food journal and adhering to recommended goals of 1150 to 1300 calories and 80+ grams of protein.   Exercise goals: No exercise has been prescribed at this time.  Behavioral modification strategies: increasing lean protein intake, no skipping meals, meal planning and cooking strategies, keeping healthy foods in the home and better snacking choices.  [name] has agreed to follow-up with our clinic in 2 to 3 weeks. She was informed of the importance of frequent follow-up visits to maximize  her success with intensive lifestyle modifications for her multiple health conditions.   [name] was informed we would discuss her lab results at her next visit unless there is a critical issue that needs to be addressed sooner. [name] agreed to keep her next visit at the agreed upon time to discuss these results.  Objective:   Blood pressure 132/75, pulse (!) 120,  temperature 98.2 F (36.8 C), temperature source Oral, height 5' (1.524 m), weight 225 lb (102.1 kg), SpO2 96 %. Body mass index is 43.94 kg/m.  General: Cooperative, alert, well developed, in no acute distress. HEENT: Conjunctivae and lids unremarkable. Cardiovascular: Regular rhythm.  Lungs: Normal work of breathing. Neurologic: No focal deficits.   Lab Results  Component Value Date   CREATININE 0.83 11/02/2019   BUN 13 11/02/2019   NA 139 11/02/2019   K 4.4 11/02/2019   CL 100 11/02/2019   CO2 23 11/02/2019   Lab Results  Component Value Date   ALT 23 11/02/2019   AST 17 11/02/2019   ALKPHOS 142 (H) 11/02/2019   BILITOT 1.0 11/02/2019   Lab Results  Component Value Date   HGBA1C 5.8 (H) 11/02/2019   Lab Results  Component Value Date   INSULIN 33.0 (H) 11/02/2019   Lab Results  Component Value Date   TSH 2.610 11/02/2019   Lab Results  Component Value Date   CHOL 243 (H) 11/02/2019   HDL 95 11/02/2019   LDLCALC 131 (H) 11/02/2019   TRIG 99 11/02/2019   Lab Results  Component Value Date   WBC 9.7 11/02/2019   HGB 15.6 11/02/2019   HCT 45.4 11/02/2019   MCV 89 11/02/2019   PLT 368 11/02/2019   No results found for: IRON, TIBC, FERRITIN  Attestation Statements:   Reviewed by clinician on day of visit: allergies, medications, problem list, medical history, surgical history, family history, social history, and previous encounter notes.  Time spent on visit including pre-visit chart review and post-visit care and charting was 15 minutes.   IKirke Corin, CMA, am acting as transcriptionist for Reuben Likes, MD   I have reviewed the above documentation for accuracy and completeness, and I agree with the above. - Katherina Mires, MD

## 2020-02-09 ENCOUNTER — Ambulatory Visit (INDEPENDENT_AMBULATORY_CARE_PROVIDER_SITE_OTHER): Payer: Medicare PPO | Admitting: Family Medicine

## 2020-02-09 ENCOUNTER — Encounter (INDEPENDENT_AMBULATORY_CARE_PROVIDER_SITE_OTHER): Payer: Self-pay | Admitting: Family Medicine

## 2020-02-09 ENCOUNTER — Other Ambulatory Visit: Payer: Self-pay

## 2020-02-09 VITALS — BP 140/74 | HR 122 | Temp 98.2°F | Ht 60.0 in | Wt 224.0 lb

## 2020-02-09 DIAGNOSIS — Z6841 Body Mass Index (BMI) 40.0 and over, adult: Secondary | ICD-10-CM

## 2020-02-09 DIAGNOSIS — I10 Essential (primary) hypertension: Secondary | ICD-10-CM

## 2020-02-09 DIAGNOSIS — E559 Vitamin D deficiency, unspecified: Secondary | ICD-10-CM

## 2020-02-13 NOTE — Progress Notes (Signed)
Chief Complaint:   OBESITY [name] is here to discuss her progress with her obesity treatment plan along with follow-up of her obesity related diagnoses. [name] is on keeping a food journal and adhering to recommended goals of 1150-1300 calories and 80+ grams of protein daily and states she is following her eating plan approximately 95% of the time. [name] states she is doing 0 minutes 0 times per week.  Today's visit was #: 7 Starting weight: 242 lbs Starting date: 11/02/2019 Today's weight: 224 lbs Today's date: 02/09/2020 Total lbs lost to date: 18 Total lbs lost since last in-office visit: 1  Interim History: [name] is eating as much as she can to try to get calories and protein in. She gets up later and finds that she starts her day off eating later. She has incorporated some CBD infused chocolate. She has not journaled but she has been mindful. She realizes that she may not be eating enough protein daily. Thanksgiving will be at her house but she won't be cooking much.  Subjective:   1. Essential hypertension [name]'s blood pressure is borderline today. She denies chest pain, chest pressure, or headache. She is on amlodipine.  2. Vitamin D deficiency [name] is on 10,000 IU daily Vit D. She denies nausea, vomiting, or muscle weakness, but she notes fatigue.  Assessment/Plan:   1. Essential hypertension Wylma will continue amlodipine, and will continue working on healthy weight loss and exercise to improve blood pressure control. We will watch for signs of hypotension as she continues her lifestyle modifications.  2. Vitamin D deficiency Low Vitamin D level contributes to fatigue and are associated with obesity, breast, and colon cancer. [name] agreed to continue taking OTC Vitamin D and will follow-up for routine testing of Vitamin D, at least 2-3 times per year to avoid over-replacement.  3. Class 3 severe obesity with serious comorbidity and body mass index (BMI) of 40.0  to 44.9 in adult, unspecified obesity type (HCC) [name] is currently in the action stage of change. As such, her goal is to continue with weight loss efforts. She has agreed to keeping a food journal and adhering to recommended goals of 1150-1300 calories and 80+ grams of protein daily.   Exercise goals: No exercise has been prescribed at this time.  Behavioral modification strategies: increasing lean protein intake, meal planning and cooking strategies, keeping healthy foods in the home and planning for success.  [name] has agreed to follow-up with our clinic in 3 weeks. She was informed of the importance of frequent follow-up visits to maximize her success with intensive lifestyle modifications for her multiple health conditions.   Objective:   Blood pressure 140/74, pulse (!) 122, temperature 98.2 F (36.8 C), temperature source Oral, height 5' (1.524 m), weight 224 lb (101.6 kg), SpO2 95 %. Body mass index is 43.75 kg/m.  General: Cooperative, alert, well developed, in no acute distress. HEENT: Conjunctivae and lids unremarkable. Cardiovascular: Regular rhythm.  Lungs: Normal work of breathing. Neurologic: No focal deficits.   Lab Results  Component Value Date   CREATININE 0.83 11/02/2019   BUN 13 11/02/2019   NA 139 11/02/2019   K 4.4 11/02/2019   CL 100 11/02/2019   CO2 23 11/02/2019   Lab Results  Component Value Date   ALT 23 11/02/2019   AST 17 11/02/2019   ALKPHOS 142 (H) 11/02/2019   BILITOT 1.0 11/02/2019   Lab Results  Component Value Date   HGBA1C 5.8 (H) 11/02/2019   Lab  Results  Component Value Date   INSULIN 33.0 (H) 11/02/2019   Lab Results  Component Value Date   TSH 2.610 11/02/2019   Lab Results  Component Value Date   CHOL 243 (H) 11/02/2019   HDL 95 11/02/2019   LDLCALC 131 (H) 11/02/2019   TRIG 99 11/02/2019   Lab Results  Component Value Date   WBC 9.7 11/02/2019   HGB 15.6 11/02/2019   HCT 45.4 11/02/2019   MCV 89 11/02/2019    PLT 368 11/02/2019   No results found for: IRON, TIBC, FERRITIN  Attestation Statements:   Reviewed by clinician on day of visit: allergies, medications, problem list, medical history, surgical history, family history, social history, and previous encounter notes.  Time spent on visit including pre-visit chart review and post-visit care and charting was 15 minutes.    I, Burt Knack, am acting as transcriptionist for Reuben Likes, MD.  I have reviewed the above documentation for accuracy and completeness, and I agree with the above. - Katherina Mires, MD

## 2020-03-01 ENCOUNTER — Ambulatory Visit (INDEPENDENT_AMBULATORY_CARE_PROVIDER_SITE_OTHER): Payer: Medicare PPO | Admitting: Family Medicine

## 2020-03-19 ENCOUNTER — Other Ambulatory Visit: Payer: Self-pay

## 2020-03-19 ENCOUNTER — Ambulatory Visit (INDEPENDENT_AMBULATORY_CARE_PROVIDER_SITE_OTHER): Payer: Medicare PPO | Admitting: Family Medicine

## 2020-03-19 ENCOUNTER — Encounter (INDEPENDENT_AMBULATORY_CARE_PROVIDER_SITE_OTHER): Payer: Self-pay | Admitting: Family Medicine

## 2020-03-19 VITALS — BP 136/77 | HR 115 | Temp 98.2°F | Ht 60.0 in | Wt 222.0 lb

## 2020-03-19 DIAGNOSIS — R7303 Prediabetes: Secondary | ICD-10-CM

## 2020-03-19 DIAGNOSIS — E7849 Other hyperlipidemia: Secondary | ICD-10-CM

## 2020-03-19 DIAGNOSIS — Z6841 Body Mass Index (BMI) 40.0 and over, adult: Secondary | ICD-10-CM | POA: Diagnosis not present

## 2020-03-20 NOTE — Progress Notes (Signed)
Chief Complaint:   OBESITY [name] is here to discuss her progress with her obesity treatment plan along with follow-up of her obesity related diagnoses. [name] is on keeping a food journal and adhering to recommended goals of 1150-1300 calories and 80+ grams of protein daily and states she is following her eating plan approximately 90-95% of the time. [name] states she is doing 0 minutes 0 times per week.  Today's visit was #: 8 Starting weight: 242 lbs Starting date: 11/02/2019 Today's weight: 222 lbs Today's date: 03/19/2020 Total lbs lost to date: 20 Total lbs lost since last in-office visit: 2  Interim History: [name] did really well following the plan for the last month. She did accidently take Ambien during the day right after Thanksgiving, which she voices that allowed Ambien induced eating to come out. She has been at 1100-1300 calories per day, but thinks she is short on 80 grams of protein. Her plans for Christmas [name] is a family meal.  Subjective:   1. Pre-diabetes [name]'s last A1c was 5.8 and insulin 33.0, and she is not on medications.  2. Other hyperlipidemia [name]'s last labs were showing LDL of 131, HDL 95, and triglycerides 99. She is not on a statin.  Assessment/Plan:   1. Pre-diabetes [name] will continue to work on weight loss, exercise, and decreasing simple carbohydrates to help decrease the risk of diabetes. We will repeat labs in 1 month (at her next appointment).  2. Other hyperlipidemia Cardiovascular risk and specific lipid/LDL goals reviewed.  We discussed several lifestyle modifications today. [name] will continue journaling, and will continue to work on exercise and weight loss efforts. We will repeat labs at her next appointment. Orders and follow up as documented in patient record.   Counseling Intensive lifestyle modifications are the first line treatment for this issue. . Dietary changes: Increase soluble fiber. Decrease simple  carbohydrates. . Exercise changes: Moderate to vigorous-intensity aerobic activity 150 minutes per week if tolerated. . Lipid-lowering medications: see documented in medical record.  3. Class 3 severe obesity with serious comorbidity and body mass index (BMI) of 40.0 to 44.9 in adult, unspecified obesity type (HCC) [name] is currently in the action stage of change. As such, her goal is to continue with weight loss efforts. She has agreed to keeping a food journal and adhering to recommended goals of 1100-1300 calories and 80 grams of protein daily.   Exercise goals: No exercise has been prescribed at this time.  Behavioral modification strategies: increasing lean protein intake, meal planning and cooking strategies, keeping healthy foods in the home and planning for success.  [name] has agreed to follow-up with our clinic in 3 to 4 weeks. She was informed of the importance of frequent follow-up visits to maximize her success with intensive lifestyle modifications for her multiple health conditions.   Objective:   Blood pressure 136/77, pulse (!) 115, temperature 98.2 F (36.8 C), temperature source Oral, height 5' (1.524 m), weight 222 lb (100.7 kg), SpO2 96 %. Body mass index is 43.36 kg/m.  General: Cooperative, alert, well developed, in no acute distress. HEENT: Conjunctivae and lids unremarkable. Cardiovascular: Regular rhythm.  Lungs: Normal work of breathing. Neurologic: No focal deficits.   Lab Results  Component Value Date   CREATININE 0.83 11/02/2019   BUN 13 11/02/2019   NA 139 11/02/2019   K 4.4 11/02/2019   CL 100 11/02/2019   CO2 23 11/02/2019   Lab Results  Component Value Date   ALT 23 11/02/2019  AST 17 11/02/2019   ALKPHOS 142 (H) 11/02/2019   BILITOT 1.0 11/02/2019   Lab Results  Component Value Date   HGBA1C 5.8 (H) 11/02/2019   Lab Results  Component Value Date   INSULIN 33.0 (H) 11/02/2019   Lab Results  Component Value Date   TSH 2.610  11/02/2019   Lab Results  Component Value Date   CHOL 243 (H) 11/02/2019   HDL 95 11/02/2019   LDLCALC 131 (H) 11/02/2019   TRIG 99 11/02/2019   Lab Results  Component Value Date   WBC 9.7 11/02/2019   HGB 15.6 11/02/2019   HCT 45.4 11/02/2019   MCV 89 11/02/2019   PLT 368 11/02/2019   No results found for: IRON, TIBC, FERRITIN  Attestation Statements:   Reviewed by clinician on day of visit: allergies, medications, problem list, medical history, surgical history, family history, social history, and previous encounter notes.  Time spent on visit including pre-visit chart review and post-visit care and charting was 15 minutes.    I, Burt Knack, am acting as transcriptionist for Reuben Likes, MD.  I have reviewed the above documentation for accuracy and completeness, and I agree with the above. - Katherina Mires, MD

## 2020-04-18 ENCOUNTER — Ambulatory Visit (INDEPENDENT_AMBULATORY_CARE_PROVIDER_SITE_OTHER): Payer: Medicare PPO | Admitting: Family Medicine

## 2020-05-03 ENCOUNTER — Ambulatory Visit (INDEPENDENT_AMBULATORY_CARE_PROVIDER_SITE_OTHER): Payer: Medicare PPO | Admitting: Family Medicine

## 2020-05-03 ENCOUNTER — Other Ambulatory Visit: Payer: Self-pay

## 2020-05-10 ENCOUNTER — Other Ambulatory Visit: Payer: Self-pay | Admitting: Orthopedic Surgery

## 2020-05-10 DIAGNOSIS — M25511 Pain in right shoulder: Secondary | ICD-10-CM

## 2020-05-21 ENCOUNTER — Other Ambulatory Visit: Payer: Self-pay

## 2020-05-21 ENCOUNTER — Encounter (INDEPENDENT_AMBULATORY_CARE_PROVIDER_SITE_OTHER): Payer: Self-pay | Admitting: Family Medicine

## 2020-05-21 ENCOUNTER — Ambulatory Visit (INDEPENDENT_AMBULATORY_CARE_PROVIDER_SITE_OTHER): Payer: Medicare PPO | Admitting: Family Medicine

## 2020-05-21 VITALS — BP 144/76 | HR 110 | Temp 97.5°F | Ht 60.0 in | Wt 223.0 lb

## 2020-05-21 DIAGNOSIS — R7303 Prediabetes: Secondary | ICD-10-CM

## 2020-05-21 DIAGNOSIS — I10 Essential (primary) hypertension: Secondary | ICD-10-CM

## 2020-05-21 DIAGNOSIS — Z6841 Body Mass Index (BMI) 40.0 and over, adult: Secondary | ICD-10-CM | POA: Diagnosis not present

## 2020-05-22 LAB — COMPREHENSIVE METABOLIC PANEL
ALT: 13 IU/L (ref 0–32)
AST: 17 IU/L (ref 0–40)
Albumin/Globulin Ratio: 1.6 (ref 1.2–2.2)
Albumin: 4.3 g/dL (ref 3.7–4.7)
Alkaline Phosphatase: 114 IU/L (ref 44–121)
BUN/Creatinine Ratio: 23 (ref 12–28)
BUN: 17 mg/dL (ref 8–27)
Bilirubin Total: 1 mg/dL (ref 0.0–1.2)
CO2: 22 mmol/L (ref 20–29)
Calcium: 9.9 mg/dL (ref 8.7–10.3)
Chloride: 103 mmol/L (ref 96–106)
Creatinine, Ser: 0.74 mg/dL (ref 0.57–1.00)
GFR calc Af Amer: 92 mL/min/{1.73_m2} (ref >59)
GFR calc non Af Amer: 80 mL/min/{1.73_m2} (ref >59)
Globulin, Total: 2.7 g/dL (ref 1.5–4.5)
Glucose: 97 mg/dL (ref 65–99)
Potassium: 4.8 mmol/L (ref 3.5–5.2)
Sodium: 140 mmol/L (ref 134–144)
Total Protein: 7 g/dL (ref 6.0–8.5)

## 2020-05-22 LAB — LIPID PANEL WITH LDL/HDL RATIO
Cholesterol, Total: 254 mg/dL — ABNORMAL HIGH (ref 100–199)
HDL: 82 mg/dL (ref >39)
LDL Chol Calc (NIH): 150 mg/dL — ABNORMAL HIGH (ref 0–99)
LDL/HDL Ratio: 1.8 ratio (ref 0.0–3.2)
Triglycerides: 128 mg/dL (ref 0–149)
VLDL Cholesterol Cal: 22 mg/dL (ref 5–40)

## 2020-05-22 LAB — INSULIN, RANDOM: INSULIN: 27.8 u[IU]/mL — ABNORMAL HIGH (ref 2.6–24.9)

## 2020-05-22 LAB — HEMOGLOBIN A1C
Est. average glucose Bld gHb Est-mCnc: 105 mg/dL
Hgb A1c MFr Bld: 5.3 % (ref 4.8–5.6)

## 2020-05-22 NOTE — Progress Notes (Signed)
Chief Complaint:   OBESITY [name] is here to discuss her progress with her obesity treatment plan along with follow-up of her obesity related diagnoses. [name] is on keeping a food journal and adhering to recommended goals of 1100-1300 calories and 80 g protein and states she is following her eating plan approximately 90-95% of the time. [name] states she is doing 0 minutes 0 times per week.  Today's visit was #: 9 Starting weight: 242 lbs Starting date: 11/02/2019 Today's weight: 223 lbs Today's date: 05/21/2020 Total lbs lost to date: 19 lbs Total lbs lost since last in-office visit: 0  Interim History: [name] celebrated her birthday over the last week with her family and had a great holiday season. She injured her right arm recently, which has decreased her mobility and activity. She is staying between 1100-1300 calories but still not sure she's getting 80 g protein. She isn't necessarily measuring everything.  Subjective:   1. Pre-diabetes [name]'s last A1c was 5.8 with an insulin level of 33.0. She is not on medication.  2. Essential hypertension [name]'s BP was controlled previously.  Pt denies chest pain, chest pressure and headache.  BP Readings from Last 3 Encounters:  05/21/20 (!) 144/76  03/19/20 136/77  02/09/20 140/74    Assessment/Plan:   1. Pre-diabetes [name] will continue to work on weight loss, exercise, and decreasing simple carbohydrates to help decrease the risk of diabetes. Check labs today.  - Hemoglobin A1c - Insulin, random  2. Essential hypertension [name] is working on healthy weight loss and exercise to improve blood pressure control. We will watch for signs of hypotension as she continues her lifestyle modifications. Check labs today.  - Lipid Panel With LDL/HDL Ratio - Comprehensive metabolic panel  3. Class 3 severe obesity with serious comorbidity and body mass index (BMI) of 40.0 to 44.9 in adult, unspecified obesity type (HCC) [name]  is currently in the action stage of change. As such, her goal is to continue with weight loss efforts. She has agreed to keeping a food journal and adhering to recommended goals of 1100-1300 calories and 80+ protein.   Exercise goals: No exercise has been prescribed at this time.  Behavioral modification strategies: increasing lean protein intake, meal planning and cooking strategies, keeping healthy foods in the home, planning for success and keeping a strict food journal.  [name] has agreed to follow-up with our clinic in 3-4 weeks. She was informed of the importance of frequent follow-up visits to maximize her success with intensive lifestyle modifications for her multiple health conditions.   [name] was informed we would discuss her lab results at her next visit unless there is a critical issue that needs to be addressed sooner. [name] agreed to keep her next visit at the agreed upon time to discuss these results.  Objective:   Blood pressure (!) 144/76, pulse (!) 110, temperature (!) 97.5 F (36.4 C), temperature source Oral, height 5' (1.524 m), weight 223 lb (101.2 kg), SpO2 96 %. Body mass index is 43.55 kg/m.  General: Cooperative, alert, well developed, in no acute distress. HEENT: Conjunctivae and lids unremarkable. Cardiovascular: Regular rhythm.  Lungs: Normal work of breathing. Neurologic: No focal deficits.   Lab Results  Component Value Date   CREATININE 0.74 05/21/2020   BUN 17 05/21/2020   NA 140 05/21/2020   K 4.8 05/21/2020   CL 103 05/21/2020   CO2 22 05/21/2020   Lab Results  Component Value Date   ALT 13 05/21/2020  AST 17 05/21/2020   ALKPHOS 114 05/21/2020   BILITOT 1.0 05/21/2020   Lab Results  Component Value Date   HGBA1C 5.3 05/21/2020   HGBA1C 5.8 (H) 11/02/2019   Lab Results  Component Value Date   INSULIN 27.8 (H) 05/21/2020   INSULIN 33.0 (H) 11/02/2019   Lab Results  Component Value Date   TSH 2.610 11/02/2019   Lab Results   Component Value Date   CHOL 254 (H) 05/21/2020   HDL 82 05/21/2020   LDLCALC 150 (H) 05/21/2020   TRIG 128 05/21/2020   Lab Results  Component Value Date   WBC 9.7 11/02/2019   HGB 15.6 11/02/2019   HCT 45.4 11/02/2019   MCV 89 11/02/2019   PLT 368 11/02/2019   No results found for: IRON, TIBC, FERRITIN  Obesity Behavioral Intervention:   Approximately 15 minutes were spent on the discussion below.  ASK: We discussed the diagnosis of obesity with [name] today and [name] agreed to give [name] permission to discuss obesity behavioral modification therapy today.  ASSESS: [name] has the diagnosis of obesity and her BMI today is 43.7. [name] is in the action stage of change.   ADVISE: [name] was educated on the multiple health risks of obesity as well as the benefit of weight loss to improve her health. She was advised of the need for long term treatment and the importance of lifestyle modifications to improve her current health and to decrease her risk of future health problems.  AGREE: Multiple dietary modification options and treatment options were discussed and [name] agreed to follow the recommendations documented in the above note.  ARRANGE: [name] was educated on the importance of frequent visits to treat obesity as outlined per CMS and USPSTF guidelines and agreed to schedule her next follow up appointment today.  Attestation Statements:   Reviewed by clinician on day of visit: allergies, medications, problem list, medical history, surgical history, family history, social history, and previous encounter notes.  Edmund Hilda, am acting as transcriptionist for Reuben Likes, MD.   I have reviewed the above documentation for accuracy and completeness, and I agree with the above. - Katherina Mires, MD

## 2020-05-31 ENCOUNTER — Other Ambulatory Visit: Payer: Self-pay

## 2020-05-31 ENCOUNTER — Ambulatory Visit
Admission: RE | Admit: 2020-05-31 | Discharge: 2020-05-31 | Disposition: A | Discharge status: Home / Self Care | Payer: Medicare PPO | Source: Ambulatory Visit | Attending: Orthopedic Surgery | Admitting: Orthopedic Surgery

## 2020-05-31 DIAGNOSIS — M25511 Pain in right shoulder: Secondary | ICD-10-CM

## 2020-06-12 ENCOUNTER — Ambulatory Visit (INDEPENDENT_AMBULATORY_CARE_PROVIDER_SITE_OTHER): Payer: Medicare PPO | Admitting: Family Medicine

## 2020-06-12 ENCOUNTER — Encounter (INDEPENDENT_AMBULATORY_CARE_PROVIDER_SITE_OTHER): Payer: Self-pay | Admitting: Family Medicine

## 2020-06-12 ENCOUNTER — Other Ambulatory Visit: Payer: Self-pay

## 2020-06-12 VITALS — BP 140/77 | HR 117 | Temp 98.1°F | Ht 60.0 in | Wt 224.0 lb

## 2020-06-12 DIAGNOSIS — I10 Essential (primary) hypertension: Secondary | ICD-10-CM

## 2020-06-12 DIAGNOSIS — Z6841 Body Mass Index (BMI) 40.0 and over, adult: Secondary | ICD-10-CM

## 2020-06-12 DIAGNOSIS — R7303 Prediabetes: Secondary | ICD-10-CM

## 2020-06-14 NOTE — Progress Notes (Signed)
Chief Complaint:   OBESITY [name] is here to discuss her progress with her obesity treatment plan along with follow-up of her obesity related diagnoses. [name] is on keeping a food journal and adhering to recommended goals of 1100-1300 calories and 80 g protein and states she is following her eating plan approximately 95% of the time. [name] states she is doing 0 minutes 0 times per week.  Today's visit was #: 10 Starting weight: 242 lbs Starting date: 11/02/2019 Today's weight: 224 lbs Today's date: 06/12/2020 Total lbs lost to date: 18 lbs Total lbs lost since last in-office visit: 0  Interim History: [name] has done some indulgent eating with popcorn over the last few weeks, but normally does have some good control with her eating. She doesn't care for a good amount of food that she used to like. She is not hungry most of the time.  Subjective:   1. Essential (primary) hypertension Pt's BP is well controlled today.  Pt denies chest pain, chest pressure and headache. She is on amlodipine.   BP Readings from Last 3 Encounters:  06/12/20 140/77  05/21/20 (!) 144/76  03/19/20 136/77    2. Pre-diabetes Pt's last A1c was 5.3 (previously 5.8), and her insulin level was 27.8 (previously 33.9).  Lab Results  Component Value Date   HGBA1C 5.3 05/21/2020   Lab Results  Component Value Date   INSULIN 27.8 (H) 05/21/2020   INSULIN 33.0 (H) 11/02/2019    Assessment/Plan:   1. Essential (primary) hypertension [name] is working on healthy weight loss and exercise to improve blood pressure control. We will watch for signs of hypotension as she continues her lifestyle modifications. Continue amlodipine. No refill needed at this time.  2. Pre-diabetes [name] will continue to work on weight loss, exercise, and decreasing simple carbohydrates to help decrease the risk of diabetes. Discussed GLP-1 at this appointment. Pt would like to discuss this with her PCP.  3. Class 3 severe  obesity with serious comorbidity and body mass index (BMI) of 40.0 to 44.9 in adult, unspecified obesity type (HCC) [name] is currently in the action stage of change. As such, her goal is to continue with weight loss efforts. She has agreed to keeping a food journal and adhering to recommended goals of 1100-1300 calories and 80+ g protein.   Exercise goals: No exercise has been prescribed at this time.  Behavioral modification strategies: increasing lean protein intake, meal planning and cooking strategies, keeping healthy foods in the home and planning for success.  [name] has agreed to follow-up with our clinic in 2 weeks. She was informed of the importance of frequent follow-up visits to maximize her success with intensive lifestyle modifications for her multiple health conditions.   Objective:   Blood pressure 140/77, pulse (!) 117, temperature 98.1 F (36.7 C), temperature source Oral, height 5' (1.524 m), weight 224 lb (101.6 kg). Body mass index is 43.75 kg/m.  General: Cooperative, alert, well developed, in no acute distress. HEENT: Conjunctivae and lids unremarkable. Cardiovascular: Regular rhythm.  Lungs: Normal work of breathing. Neurologic: No focal deficits.   Lab Results  Component Value Date   CREATININE 0.74 05/21/2020   BUN 17 05/21/2020   NA 140 05/21/2020   K 4.8 05/21/2020   CL 103 05/21/2020   CO2 22 05/21/2020   Lab Results  Component Value Date   ALT 13 05/21/2020   AST 17 05/21/2020   ALKPHOS 114 05/21/2020   BILITOT 1.0 05/21/2020   Lab Results  Component Value Date   HGBA1C 5.3 05/21/2020   HGBA1C 5.8 (H) 11/02/2019   Lab Results  Component Value Date   INSULIN 27.8 (H) 05/21/2020   INSULIN 33.0 (H) 11/02/2019   Lab Results  Component Value Date   TSH 2.610 11/02/2019   Lab Results  Component Value Date   CHOL 254 (H) 05/21/2020   HDL 82 05/21/2020   LDLCALC 150 (H) 05/21/2020   TRIG 128 05/21/2020   Lab Results  Component Value  Date   WBC 9.7 11/02/2019   HGB 15.6 11/02/2019   HCT 45.4 11/02/2019   MCV 89 11/02/2019   PLT 368 11/02/2019     Attestation Statements:   Reviewed by clinician on day of visit: allergies, medications, problem list, medical history, surgical history, family history, social history, and previous encounter notes.  Time spent on visit including pre-visit chart review and post-visit care and charting was 15 minutes.   Edmund Hilda, am acting as transcriptionist for Reuben Likes, MD.   I have reviewed the above documentation for accuracy and completeness, and I agree with the above. - Katherina Mires, MD

## 2020-06-26 ENCOUNTER — Other Ambulatory Visit: Payer: Self-pay

## 2020-06-26 ENCOUNTER — Encounter (INDEPENDENT_AMBULATORY_CARE_PROVIDER_SITE_OTHER): Payer: Self-pay | Admitting: Family Medicine

## 2020-06-26 ENCOUNTER — Ambulatory Visit (INDEPENDENT_AMBULATORY_CARE_PROVIDER_SITE_OTHER): Payer: Medicare PPO | Admitting: Family Medicine

## 2020-06-26 VITALS — BP 122/79 | HR 114 | Temp 97.7°F | Ht 60.0 in | Wt 222.0 lb

## 2020-06-26 DIAGNOSIS — E038 Other specified hypothyroidism: Secondary | ICD-10-CM

## 2020-06-26 DIAGNOSIS — R7303 Prediabetes: Secondary | ICD-10-CM

## 2020-06-26 DIAGNOSIS — E66813 Obesity, class 3: Secondary | ICD-10-CM

## 2020-06-26 DIAGNOSIS — Z6841 Body Mass Index (BMI) 40.0 and over, adult: Secondary | ICD-10-CM | POA: Diagnosis not present

## 2020-07-03 NOTE — Progress Notes (Signed)
Chief Complaint:   OBESITY [name] is here to discuss her progress with her obesity treatment plan along with follow-up of her obesity related diagnoses. [name] is on keeping a food journal and adhering to recommended goals of 1100-1300 calories and 80+ g protein and states she is following her eating plan approximately 98% of the time. [name] states she is not currently exercising.  Today's visit was #: 11 Starting weight: 242 lbs Starting date: 11/02/2019 Today's weight: 222 lbs Today's date: 06/26/2020 Total lbs lost to date: 20 Total lbs lost since last in-office visit: 2  Interim History: Pt has written down food for about 4 days and has been correct at around 1100-1300 calories. She find that she has less bowel movements with eating more protein. She is eating popcorn in hopes of increasing bowel movement frequency. Pt is realizing that she often has double checked her intake and is more strictly following plan when double checking.  Subjective:   1. Other specified hypothyroidism Pt is on levothyroxine 25 mcg. Her last TSH was 2.610.  2. Pre-diabetes Pt's recent A1c was 5.3 (previously 5.8). She is not on medication.  Lab Results  Component Value Date   HGBA1C 5.3 05/21/2020   Lab Results  Component Value Date   INSULIN 27.8 (H) 05/21/2020   INSULIN 33.0 (H) 11/02/2019    Assessment/Plan:   1. Other specified hypothyroidism Patient with long-standing hypothyroidism, on levothyroxine therapy. She appears euthyroid. Orders and follow up as documented in patient record. Continue current treatment plan.  Counseling . Good thyroid control is important for overall health. Supratherapeutic thyroid levels are dangerous and will not improve weight loss results. . The correct way to take levothyroxine is fasting, with water, separated by at least 30 minutes from breakfast, and separated by more than 4 hours from calcium, iron, multivitamins, acid reflux medications (PPIs).    2. Pre-diabetes [name] will continue to work on weight loss, exercise, and decreasing simple carbohydrates to help decrease the risk of diabetes. Repeat labs in 3-4 months.  3. Class 3 severe obesity with serious comorbidity and body mass index (BMI) of 45.0 to 49.9 in adult, unspecified obesity type (HCC) [name] is currently in the action stage of change. As such, her goal is to continue with weight loss efforts. She has agreed to keeping a food journal and adhering to recommended goals of 1100-1300 calories and 80+ g protein.   Exercise goals: No exercise has been prescribed at this time.  Behavioral modification strategies: increasing lean protein intake, meal planning and cooking strategies, keeping healthy foods in the home, planning for success and keeping a strict food journal.  [name] has agreed to follow-up with our clinic in 3 weeks. She was informed of the importance of frequent follow-up visits to maximize her success with intensive lifestyle modifications for her multiple health conditions.   Objective:   Blood pressure 122/79, pulse (!) 114, temperature 97.7 F (36.5 C), temperature source Oral, height 5' (1.524 m), weight 222 lb (100.7 kg), SpO2 98 %. Body mass index is 43.36 kg/m.  General: Cooperative, alert, well developed, in no acute distress. HEENT: Conjunctivae and lids unremarkable. Cardiovascular: Regular rhythm.  Lungs: Normal work of breathing. Neurologic: No focal deficits.   Lab Results  Component Value Date   CREATININE 0.74 05/21/2020   BUN 17 05/21/2020   NA 140 05/21/2020   K 4.8 05/21/2020   CL 103 05/21/2020   CO2 22 05/21/2020   Lab Results  Component Value Date  ALT 13 05/21/2020   AST 17 05/21/2020   ALKPHOS 114 05/21/2020   BILITOT 1.0 05/21/2020   Lab Results  Component Value Date   HGBA1C 5.3 05/21/2020   HGBA1C 5.8 (H) 11/02/2019   Lab Results  Component Value Date   INSULIN 27.8 (H) 05/21/2020   INSULIN 33.0 (H)  11/02/2019   Lab Results  Component Value Date   TSH 2.610 11/02/2019   Lab Results  Component Value Date   CHOL 254 (H) 05/21/2020   HDL 82 05/21/2020   LDLCALC 150 (H) 05/21/2020   TRIG 128 05/21/2020   Lab Results  Component Value Date   WBC 9.7 11/02/2019   HGB 15.6 11/02/2019   HCT 45.4 11/02/2019   MCV 89 11/02/2019   PLT 368 11/02/2019    Attestation Statements:   Reviewed by clinician on day of visit: allergies, medications, problem list, medical history, surgical history, family history, social history, and previous encounter notes.  Time spent on visit including pre-visit chart review and post-visit care and charting was 15 minutes.   Edmund Hilda, am acting as transcriptionist for Reuben Likes, MD.   I have reviewed the above documentation for accuracy and completeness, and I agree with the above. - Katherina Mires, MD

## 2020-07-18 ENCOUNTER — Ambulatory Visit (INDEPENDENT_AMBULATORY_CARE_PROVIDER_SITE_OTHER): Payer: Medicare PPO | Admitting: Family Medicine

## 2020-08-09 ENCOUNTER — Ambulatory Visit (INDEPENDENT_AMBULATORY_CARE_PROVIDER_SITE_OTHER): Payer: Medicare PPO | Admitting: Family Medicine

## 2020-08-23 ENCOUNTER — Other Ambulatory Visit: Payer: Self-pay

## 2020-08-23 ENCOUNTER — Encounter (INDEPENDENT_AMBULATORY_CARE_PROVIDER_SITE_OTHER): Payer: Self-pay | Admitting: Family Medicine

## 2020-08-23 ENCOUNTER — Ambulatory Visit (INDEPENDENT_AMBULATORY_CARE_PROVIDER_SITE_OTHER): Payer: Medicare PPO | Admitting: Family Medicine

## 2020-08-23 VITALS — BP 112/77 | HR 110 | Temp 98.0°F | Ht 60.0 in | Wt 222.0 lb

## 2020-08-23 DIAGNOSIS — R7309 Other abnormal glucose: Secondary | ICD-10-CM

## 2020-08-23 DIAGNOSIS — Z6841 Body Mass Index (BMI) 40.0 and over, adult: Secondary | ICD-10-CM

## 2020-08-23 DIAGNOSIS — I10 Essential (primary) hypertension: Secondary | ICD-10-CM | POA: Diagnosis not present

## 2020-08-23 DIAGNOSIS — K5909 Other constipation: Secondary | ICD-10-CM

## 2020-08-23 MED ORDER — OZEMPIC (0.25 OR 0.5 MG/DOSE) 2 MG/1.5ML ~~LOC~~ SOPN: 0.2500 mg | PEN_INJECTOR | SUBCUTANEOUS | 0 refills | Status: DC

## 2020-08-23 MED ORDER — POLYETHYLENE GLYCOL 3350 17 GM/SCOOP PO POWD: 17.0000 g | Freq: Every day | ORAL | 1 refills | Status: AC

## 2020-08-28 ENCOUNTER — Telehealth (INDEPENDENT_AMBULATORY_CARE_PROVIDER_SITE_OTHER): Payer: Self-pay

## 2020-08-28 NOTE — Telephone Encounter (Signed)
Patient request call in regards to RX given at last appt. Patient does not have my chart. Phone # [phone]

## 2020-08-29 NOTE — Progress Notes (Signed)
Chief Complaint:   OBESITY Aradia is here to discuss her progress with her obesity treatment plan along with follow-up of her obesity related diagnoses. Catalaya is on keeping a food journal and adhering to recommended goals of 1100-1300 calories and 80 g protein and states she is following her eating plan approximately 93% of the time. [name] states she is not currently exercising.  Today's visit was #: 12 Starting weight: 242 lbs Starting date: 11/02/2019 Today's weight: 222 lbs Today's date: 08/23/2020 Total lbs lost to date: 20 Total lbs lost since last in-office visit: 0  Interim History: Maree hasn't come in due to pt hasn't been losing. She took 1 week where she only ate what she wanted then got back on meal plan. She has been trying to do more activity. She is experiencing non-scale victories- fitting into clothes she hasn't fit into since 6 years ago. She is ending up 1100-1300 calorie and is getting protein goal daily.  Subjective:   1. Elevated random blood glucose level Mackinzie has elevated BS on almost all prior labs. She has propensity for carbohydrates.  2. Essential (primary) hypertension Onalee's BP is controlled. She is on amlodipine. Pt denies chest pain/chest pressure/headache.  3. Other constipation Evalin hasn't tried Miralax yet. She reports a change in stool consistency.  Assessment/Plan:   1. Elevated random blood glucose level Fasting labs will be obtained and results with be discussed with Sheron in 2 weeks at her follow up visit. In the meanwhile Zanaiya was started on a lower simple carbohydrate diet and will work on weight loss efforts. -Start Ozempic 0.25 mg, as prescribed below. - Semaglutide,0.25 or 0.5MG /DOS, (OZEMPIC, 0.25 OR 0.5 MG/DOSE,) 2 MG/1.5ML SOPN; Inject 0.25 mg into the skin once a week.  Dispense: 1.5 mL; Refill: 0  2. Essential (primary) hypertension Ayumi is working on healthy weight loss and exercise to improve blood pressure  control. We will watch for signs of hypotension as she continues her lifestyle modifications. -Continue amlodipine with no change in dose.  3. Other constipation Damaya was informed that a decrease in bowel movement frequency is normal while losing weight, but stools should not be hard or painful. Orders and follow up as documented in patient record.  -Start Miralax 17 grams, as prescribed below.  Counseling Getting to Good Bowel Health: Your goal is to have one soft bowel movement each day. Drink at least 8 glasses of water each day. Eat plenty of fiber (goal is over 25 grams each day). It is best to get most of your fiber from dietary sources which includes leafy green vegetables, fresh fruit, and whole grains. You may need to add fiber with the help of OTC fiber supplements. These include Metamucil, Citrucel, and Flaxseed. If you are still having trouble, try adding Miralax or Magnesium Citrate. If all of these changes do not work, Dietitian.  - polyethylene glycol powder (GLYCOLAX/MIRALAX) 17 GM/SCOOP powder; Take 17 g by mouth daily.  Dispense: 3350 g; Refill: 1  4. Class 3 severe obesity with serious comorbidity and body mass index (BMI) of 45.0 to 49.9 in adult, unspecified obesity type (HCC) Tamika is currently in the action stage of change. As such, her goal is to continue with weight loss efforts. She has agreed to keeping a food journal and adhering to recommended goals of 1100-1300 calories and 80+ grams protein.   Exercise goals: All adults should avoid inactivity. Some physical activity is better than none, and adults who participate in  any amount of physical activity gain some health benefits.  Behavioral modification strategies: increasing lean protein intake, meal planning and cooking strategies, keeping healthy foods in the home and planning for success.  Kawanna has agreed to follow-up with our clinic in 3-4 weeks. She was informed of the importance of frequent  follow-up visits to maximize her success with intensive lifestyle modifications for her multiple health conditions.   Objective:   Blood pressure 112/77, pulse (!) 110, temperature 98 F (36.7 C), height 5' (1.524 m), weight 222 lb (100.7 kg), SpO2 97 %. Body mass index is 43.36 kg/m.  General: Cooperative, alert, well developed, in no acute distress. HEENT: Conjunctivae and lids unremarkable. Cardiovascular: Regular rhythm.  Lungs: Normal work of breathing. Neurologic: No focal deficits.   Lab Results  Component Value Date   CREATININE 0.74 05/21/2020   BUN 17 05/21/2020   NA 140 05/21/2020   K 4.8 05/21/2020   CL 103 05/21/2020   CO2 22 05/21/2020   Lab Results  Component Value Date   ALT 13 05/21/2020   AST 17 05/21/2020   ALKPHOS 114 05/21/2020   BILITOT 1.0 05/21/2020   Lab Results  Component Value Date   HGBA1C 5.3 05/21/2020   HGBA1C 5.8 (H) 11/02/2019   Lab Results  Component Value Date   INSULIN 27.8 (H) 05/21/2020   INSULIN 33.0 (H) 11/02/2019   Lab Results  Component Value Date   TSH 2.610 11/02/2019   Lab Results  Component Value Date   CHOL 254 (H) 05/21/2020   HDL 82 05/21/2020   LDLCALC 150 (H) 05/21/2020   TRIG 128 05/21/2020   Lab Results  Component Value Date   WBC 9.7 11/02/2019   HGB 15.6 11/02/2019   HCT 45.4 11/02/2019   MCV 89 11/02/2019   PLT 368 11/02/2019   No results found for: IRON, TIBC, FERRITIN  Obesity Behavioral Intervention:   Approximately 15 minutes were spent on the discussion below.  ASK: We discussed the diagnosis of obesity with Zaylynn today and Britainy agreed to give [name] permission to discuss obesity behavioral modification therapy today.  ASSESS: Katniss has the diagnosis of obesity and her BMI today is 43.5. Hamdi is in the action stage of change.   ADVISE: Gladine was educated on the multiple health risks of obesity as well as the benefit of weight loss to improve her health. She was advised of the need  for long term treatment and the importance of lifestyle modifications to improve her current health and to decrease her risk of future health problems.  AGREE: Multiple dietary modification options and treatment options were discussed and Ayushi agreed to follow the recommendations documented in the above note.  ARRANGE: Jaquelyn was educated on the importance of frequent visits to treat obesity as outlined per CMS and USPSTF guidelines and agreed to schedule her next follow up appointment today.  Attestation Statements:   Reviewed by clinician on day of visit: allergies, medications, problem list, medical history, surgical history, family history, social history, and previous encounter notes.  Edmund Hilda, CMA, am acting as transcriptionist for Reuben Likes, MD.   I have reviewed the above documentation for accuracy and completeness, and I agree with the above. - Katherina Mires, MD

## 2020-08-30 NOTE — Telephone Encounter (Signed)
Contacted pt and left voicemail requesting a call back to discuss whatever questions she had regarding her last visit.

## 2020-09-18 ENCOUNTER — Ambulatory Visit (INDEPENDENT_AMBULATORY_CARE_PROVIDER_SITE_OTHER): Payer: Medicare PPO | Admitting: Family Medicine

## 2020-09-18 ENCOUNTER — Other Ambulatory Visit: Payer: Self-pay

## 2020-09-18 ENCOUNTER — Encounter (INDEPENDENT_AMBULATORY_CARE_PROVIDER_SITE_OTHER): Payer: Self-pay | Admitting: Family Medicine

## 2020-09-18 VITALS — HR 109 | Temp 97.9°F | Ht 60.0 in | Wt 220.0 lb

## 2020-09-18 DIAGNOSIS — Z6841 Body Mass Index (BMI) 40.0 and over, adult: Secondary | ICD-10-CM | POA: Diagnosis not present

## 2020-09-18 DIAGNOSIS — I10 Essential (primary) hypertension: Secondary | ICD-10-CM | POA: Diagnosis not present

## 2020-09-18 DIAGNOSIS — R7309 Other abnormal glucose: Secondary | ICD-10-CM

## 2020-09-18 MED ORDER — OZEMPIC (0.25 OR 0.5 MG/DOSE) 2 MG/1.5ML ~~LOC~~ SOPN: 0.5000 mg | PEN_INJECTOR | SUBCUTANEOUS | 0 refills | Status: DC

## 2020-09-19 NOTE — Progress Notes (Signed)
Chief Complaint:   OBESITY [name] is here to discuss her progress with her obesity treatment plan along with follow-up of her obesity related diagnoses. [name] is on keeping a food journal and adhering to recommended goals of 1100-1300 calories and 80+ grams protein and states she is following her eating plan approximately 95% of the time. [name] states she is being as active as possible.  Today's visit was #: 13 Starting weight: 242 lbs Starting date: 11/02/2019 Today's weight: 220 lbs Today's date: 09/18/2020 Total lbs lost to date: 22 Total lbs lost since last in-office visit: 2  Interim History: [name] hasn't noticed much difference on Ozempic. She denies hunger. She has to eat slower to get it all down. Pt hasn't g back to the pool yet but plans to do so. She has been journaling only intermittently due to eating pretty much the same thing everyday.  Subjective:   1. Elevated random blood glucose level [name] denies GI side effects of GLP-1. She has some improvement in eating regimen.  2. Essential (primary) hypertension [name]'s BP is slightly elevated today. She denies chest pain/chest pressure/headache.  Assessment/Plan:   1. Elevated random blood glucose level Increase Ozempic to 0.5 mg, as prescribed below.  - Semaglutide,0.25 or 0.5MG /DOS, (OZEMPIC, 0.25 OR 0.5 MG/DOSE,) 2 MG/1.5ML SOPN; Inject 0.5 mg into the skin once a week.  Dispense: 1.5 mL; Refill: 0  2. Essential (primary) hypertension [name] is working on healthy weight loss and exercise to improve blood pressure control. We will watch for signs of hypotension as she continues her lifestyle modifications. Continue current medication with no change in dose. Follow up on BP at next appt.  3. Class 3 severe obesity with serious comorbidity and body mass index (BMI) of 45.0 to 49.9 in adult, unspecified obesity type (HCC)  [name] is currently in the action stage of change. As such, her goal is to continue with  weight loss efforts. She has agreed to keeping a food journal and adhering to recommended goals of 1100-1300 calories and 80+ grams protein.   Exercise goals: All adults should avoid inactivity. Some physical activity is better than none, and adults who participate in any amount of physical activity gain some health benefits.  Behavioral modification strategies: increasing lean protein intake, meal planning and cooking strategies, keeping healthy foods in the home, and planning for success.  [name] has agreed to follow-up with our clinic in 3-4 weeks. She was informed of the importance of frequent follow-up visits to maximize her success with intensive lifestyle modifications for her multiple health conditions.   Objective:   Pulse (!) 109, temperature 97.9 F (36.6 C), height 5' (1.524 m), weight 220 lb (99.8 kg), SpO2 95 %. Body mass index is 42.97 kg/m.  General: Cooperative, alert, well developed, in no acute distress. HEENT: Conjunctivae and lids unremarkable. Cardiovascular: Regular rhythm.  Lungs: Normal work of breathing. Neurologic: No focal deficits.   Lab Results  Component Value Date   CREATININE 0.74 05/21/2020   BUN 17 05/21/2020   NA 140 05/21/2020   K 4.8 05/21/2020   CL 103 05/21/2020   CO2 22 05/21/2020   Lab Results  Component Value Date   ALT 13 05/21/2020   AST 17 05/21/2020   ALKPHOS 114 05/21/2020   BILITOT 1.0 05/21/2020   Lab Results  Component Value Date   HGBA1C 5.3 05/21/2020   HGBA1C 5.8 (H) 11/02/2019   Lab Results  Component Value Date   INSULIN 27.8 (H) 05/21/2020  INSULIN 33.0 (H) 11/02/2019   Lab Results  Component Value Date   TSH 2.610 11/02/2019   Lab Results  Component Value Date   CHOL 254 (H) 05/21/2020   HDL 82 05/21/2020   LDLCALC 150 (H) 05/21/2020   TRIG 128 05/21/2020   Lab Results  Component Value Date   WBC 9.7 11/02/2019   HGB 15.6 11/02/2019   HCT 45.4 11/02/2019   MCV 89 11/02/2019   PLT 368 11/02/2019    No results found for: IRON, TIBC, FERRITIN  Obesity Behavioral Intervention:   Approximately 15 minutes were spent on the discussion below.  ASK: We discussed the diagnosis of obesity with [name] today and [name] agreed to give [name] permission to discuss obesity behavioral modification therapy today.  ASSESS: [name] has the diagnosis of obesity and her BMI today is 43.0. [name] is in the action stage of change.   ADVISE: [name] was educated on the multiple health risks of obesity as well as the benefit of weight loss to improve her health. She was advised of the need for long term treatment and the importance of lifestyle modifications to improve her current health and to decrease her risk of future health problems.  AGREE: Multiple dietary modification options and treatment options were discussed and [name] agreed to follow the recommendations documented in the above note.  ARRANGE: [name] was educated on the importance of frequent visits to treat obesity as outlined per CMS and USPSTF guidelines and agreed to schedule her next follow up appointment today.  Attestation Statements:   Reviewed by clinician on day of visit: allergies, medications, problem list, medical history, surgical history, family history, social history, and previous encounter notes.  Edmund Hilda, CMA, am acting as transcriptionist for Reuben Likes, MD.   I have reviewed the above documentation for accuracy and completeness, and I agree with the above. - Katherina Mires, MD

## 2020-10-09 ENCOUNTER — Ambulatory Visit (INDEPENDENT_AMBULATORY_CARE_PROVIDER_SITE_OTHER): Payer: Medicare PPO | Admitting: Family Medicine

## 2020-10-09 ENCOUNTER — Other Ambulatory Visit: Payer: Self-pay

## 2020-10-09 ENCOUNTER — Encounter (INDEPENDENT_AMBULATORY_CARE_PROVIDER_SITE_OTHER): Payer: Self-pay | Admitting: Family Medicine

## 2020-10-09 VITALS — BP 130/82 | HR 115 | Temp 98.3°F | Ht 60.0 in | Wt 219.0 lb

## 2020-10-09 DIAGNOSIS — R7309 Other abnormal glucose: Secondary | ICD-10-CM

## 2020-10-09 DIAGNOSIS — Z6841 Body Mass Index (BMI) 40.0 and over, adult: Secondary | ICD-10-CM | POA: Diagnosis not present

## 2020-10-09 DIAGNOSIS — I10 Essential (primary) hypertension: Secondary | ICD-10-CM

## 2020-10-09 MED ORDER — OZEMPIC (0.25 OR 0.5 MG/DOSE) 2 MG/1.5ML ~~LOC~~ SOPN: 0.5000 mg | PEN_INJECTOR | SUBCUTANEOUS | 0 refills | Status: DC

## 2020-10-15 NOTE — Progress Notes (Signed)
Chief Complaint:   OBESITY [name] is here to discuss her progress with her obesity treatment plan along with follow-up of her obesity related diagnoses. [name] is on keeping a food journal and adhering to recommended goals of 1100-1300 calories and 80+ g protein and states she is following her eating plan approximately 95% of the time. [name] states she is not currently exercising.  Today's visit was #: 14 Starting weight: 242 lbs Starting date: 11/02/2019 Today's weight: 216 lbs Today's date: 10/09/2020 Total lbs lost to date: 26 Total lbs lost since last in-office visit: 4  Interim History: [name] brother is visiting and she has had a really great time. She has been watching his dog. Food wise, she is eating on plan and getting in all calories and getting 90 grams protein per day. She is actually getting hungry for last protein of the day. Cottage cheese and fruit in the morning, stick of cheese if hungry, salad and protein for supper and protein bar at night.  Subjective:   1. Elevated random blood glucose level Concern over lack of refrigeration due to early refill. Her last A1c was 5.3 and insulin level 27.8.  2. Essential (primary) hypertension [name]'s BP is well controlled. Pt denies chest pain/chest pressure/headache. She is on amlodipine 10 mg daily.  Assessment/Plan:   1. Elevated random blood glucose level Continue Ozempic 0.5 mg as directed.  Refill- Semaglutide,0.25 or 0.5MG /DOS, (OZEMPIC, 0.25 OR 0.5 MG/DOSE,) 2 MG/1.5ML SOPN; Inject 0.5 mg into the skin once a week.  Dispense: 1.5 mL; Refill: 0  2. Essential (primary) hypertension [name] is working on healthy weight loss and exercise to improve blood pressure control. We will watch for signs of hypotension as she continues her lifestyle modifications. Continue current treatment plan.  3. Obesity BMI 42.3  [name] is currently in the action stage of change. As such, her goal is to continue with weight loss  efforts. She has agreed to keeping a food journal and adhering to recommended goals of 1100-1300 calories and 80+ g protein.   Exercise goals: No exercise has been prescribed at this time.  Behavioral modification strategies: increasing lean protein intake, meal planning and cooking strategies, keeping healthy foods in the home, and planning for success.  [name] has agreed to follow-up with our clinic in 3 weeks. She was informed of the importance of frequent follow-up visits to maximize her success with intensive lifestyle modifications for her multiple health conditions.   Objective:   Blood pressure 130/82, pulse (!) 115, temperature 98.3 F (36.8 C), height 5' (1.524 m), weight 219 lb (99.3 kg), SpO2 95 %. Body mass index is 42.77 kg/m.  General: Cooperative, alert, well developed, in no acute distress. HEENT: Conjunctivae and lids unremarkable. Cardiovascular: Regular rhythm.  Lungs: Normal work of breathing. Neurologic: No focal deficits.   Lab Results  Component Value Date   CREATININE 0.74 05/21/2020   BUN 17 05/21/2020   NA 140 05/21/2020   K 4.8 05/21/2020   CL 103 05/21/2020   CO2 22 05/21/2020   Lab Results  Component Value Date   ALT 13 05/21/2020   AST 17 05/21/2020   ALKPHOS 114 05/21/2020   BILITOT 1.0 05/21/2020   Lab Results  Component Value Date   HGBA1C 5.3 05/21/2020   HGBA1C 5.8 (H) 11/02/2019   Lab Results  Component Value Date   INSULIN 27.8 (H) 05/21/2020   INSULIN 33.0 (H) 11/02/2019   Lab Results  Component Value Date   TSH 2.610  11/02/2019   Lab Results  Component Value Date   CHOL 254 (H) 05/21/2020   HDL 82 05/21/2020   LDLCALC 150 (H) 05/21/2020   TRIG 128 05/21/2020   Lab Results  Component Value Date   VD25OH 78.9 11/02/2019   Lab Results  Component Value Date   WBC 9.7 11/02/2019   HGB 15.6 11/02/2019   HCT 45.4 11/02/2019   MCV 89 11/02/2019   PLT 368 11/02/2019   No results found for: IRON, TIBC,  FERRITIN  Obesity Behavioral Intervention:   Approximately 15 minutes were spent on the discussion below.  ASK: We discussed the diagnosis of obesity with [name] today and [name] agreed to give [name] permission to discuss obesity behavioral modification therapy today.  ASSESS: [name] has the diagnosis of obesity and her BMI today is 42.3. [name] is in the action stage of change.   ADVISE: [name] was educated on the multiple health risks of obesity as well as the benefit of weight loss to improve her health. She was advised of the need for long term treatment and the importance of lifestyle modifications to improve her current health and to decrease her risk of future health problems.  AGREE: Multiple dietary modification options and treatment options were discussed and [name] agreed to follow the recommendations documented in the above note.  ARRANGE: [name] was educated on the importance of frequent visits to treat obesity as outlined per CMS and USPSTF guidelines and agreed to schedule her next follow up appointment today.  Attestation Statements:   Reviewed by clinician on day of visit: allergies, medications, problem list, medical history, surgical history, family history, social history, and previous encounter notes.  Edmund Hilda, CMA, am acting as transcriptionist for Reuben Likes, MD.   I have reviewed the above documentation for accuracy and completeness, and I agree with the above. - Reuben Likes, MD

## 2020-10-31 ENCOUNTER — Encounter (INDEPENDENT_AMBULATORY_CARE_PROVIDER_SITE_OTHER): Payer: Self-pay | Admitting: Family Medicine

## 2020-10-31 ENCOUNTER — Ambulatory Visit (INDEPENDENT_AMBULATORY_CARE_PROVIDER_SITE_OTHER): Payer: Medicare PPO | Admitting: Family Medicine

## 2020-10-31 ENCOUNTER — Other Ambulatory Visit: Payer: Self-pay

## 2020-10-31 VITALS — BP 136/82 | HR 112 | Temp 98.2°F | Ht 60.0 in | Wt 216.0 lb

## 2020-10-31 DIAGNOSIS — I10 Essential (primary) hypertension: Secondary | ICD-10-CM

## 2020-10-31 DIAGNOSIS — R7309 Other abnormal glucose: Secondary | ICD-10-CM | POA: Diagnosis not present

## 2020-10-31 DIAGNOSIS — R739 Hyperglycemia, unspecified: Secondary | ICD-10-CM

## 2020-10-31 DIAGNOSIS — Z6841 Body Mass Index (BMI) 40.0 and over, adult: Secondary | ICD-10-CM

## 2020-10-31 DIAGNOSIS — E66813 Obesity, class 3: Secondary | ICD-10-CM

## 2020-11-01 NOTE — Progress Notes (Signed)
Chief Complaint:   OBESITY [name] is here to discuss her progress with her obesity treatment plan along with follow-up of her obesity related diagnoses. [name] is on keeping a food journal and adhering to recommended goals of 1100-1300 calories and 80+ grams protein and states she is following her eating plan approximately 90% of the time. [name] states she is not currently exercising.  Today's visit was #: 15 Starting weight: 242 lbs Starting date: 11/02/2019 Today's weight: 216 lbs Today's date: 10/31/2020 Total lbs lost to date: 26 Total lbs lost since last in-office visit: 0  Interim History: [name] voices that she saw someone else on Ozempic and that they lost a considerable amount of weight. She has no plans for the next few weeks. Pt is able to get in all calories and protein in daily. She eats the same foods habitually.  Subjective:   1. Elevated random blood glucose level [name] is on Ozempic 0.5 mg weekly. She denies GI side effects on current dose.  2. Essential (primary) hypertension [name] BP is controlled today. Pt denies chest pain/chest pressure/headache. She is on Norvasc 10 mg daily.  Assessment/Plan:   1. Elevated random blood glucose level Continue Ozempic 0.5 mg weekly.  2. Essential (primary) hypertension [name] is working on healthy weight loss and exercise to improve blood pressure control. We will watch for signs of hypotension as she continues her lifestyle modifications. Continue current Norvasc dose with no change in dose.  3. Obesity with current BMI of 42.3  [name] is currently in the action stage of change. As such, her goal is to continue with weight loss efforts. She has agreed to keeping a food journal and adhering to recommended goals of 1100-1300 calories and 80+ grams protein.   Exercise goals: No exercise has been prescribed at this time.  Behavioral modification strategies: increasing lean protein intake, meal planning and cooking  strategies, keeping healthy foods in the home, and planning for success.  [name] has agreed to follow-up with our clinic in 3 weeks. She was informed of the importance of frequent follow-up visits to maximize her success with intensive lifestyle modifications for her multiple health conditions.   Objective:   Blood pressure 136/82, pulse (!) 112, temperature 98.2 F (36.8 C), height 5' (1.524 m), weight 216 lb (98 kg), SpO2 95 %. Body mass index is 42.18 kg/m.  General: Cooperative, alert, well developed, in no acute distress. HEENT: Conjunctivae and lids unremarkable. Cardiovascular: Regular rhythm.  Lungs: Normal work of breathing. Neurologic: No focal deficits.   Lab Results  Component Value Date   CREATININE 0.74 05/21/2020   BUN 17 05/21/2020   NA 140 05/21/2020   K 4.8 05/21/2020   CL 103 05/21/2020   CO2 22 05/21/2020   Lab Results  Component Value Date   ALT 13 05/21/2020   AST 17 05/21/2020   ALKPHOS 114 05/21/2020   BILITOT 1.0 05/21/2020   Lab Results  Component Value Date   HGBA1C 5.3 05/21/2020   HGBA1C 5.8 (H) 11/02/2019   Lab Results  Component Value Date   INSULIN 27.8 (H) 05/21/2020   INSULIN 33.0 (H) 11/02/2019   Lab Results  Component Value Date   TSH 2.610 11/02/2019   Lab Results  Component Value Date   CHOL 254 (H) 05/21/2020   HDL 82 05/21/2020   LDLCALC 150 (H) 05/21/2020   TRIG 128 05/21/2020   Lab Results  Component Value Date   VD25OH 78.9 11/02/2019   Lab Results  Component Value Date   WBC 9.7 11/02/2019   HGB 15.6 11/02/2019   HCT 45.4 11/02/2019   MCV 89 11/02/2019   PLT 368 11/02/2019    Attestation Statements:   Reviewed by clinician on day of visit: allergies, medications, problem list, medical history, surgical history, family history, social history, and previous encounter notes.  Time spent on visit including pre-visit chart review and post-visit care and charting was 15 minutes.   Edmund Hilda, CMA,  am acting as transcriptionist for Reuben Likes, MD.   I have reviewed the above documentation for accuracy and completeness, and I agree with the above. - Reuben Likes, MD

## 2020-11-27 ENCOUNTER — Ambulatory Visit (INDEPENDENT_AMBULATORY_CARE_PROVIDER_SITE_OTHER): Payer: Medicare PPO | Admitting: Family Medicine

## 2020-11-27 ENCOUNTER — Encounter (INDEPENDENT_AMBULATORY_CARE_PROVIDER_SITE_OTHER): Payer: Self-pay | Admitting: Family Medicine

## 2020-11-27 ENCOUNTER — Other Ambulatory Visit: Payer: Self-pay

## 2020-11-27 VITALS — BP 121/80 | HR 121 | Temp 98.4°F | Ht 60.0 in | Wt 215.0 lb

## 2020-11-27 DIAGNOSIS — R7309 Other abnormal glucose: Secondary | ICD-10-CM | POA: Diagnosis not present

## 2020-11-27 DIAGNOSIS — Z6841 Body Mass Index (BMI) 40.0 and over, adult: Secondary | ICD-10-CM

## 2020-11-27 DIAGNOSIS — R000 Tachycardia, unspecified: Secondary | ICD-10-CM | POA: Diagnosis not present

## 2020-11-27 MED ORDER — SEMAGLUTIDE (1 MG/DOSE) 4 MG/3ML ~~LOC~~ SOPN: 1.0000 mg | PEN_INJECTOR | SUBCUTANEOUS | 0 refills | Status: DC

## 2020-11-28 NOTE — Progress Notes (Signed)
Chief Complaint:   OBESITY [name] is here to discuss her progress with her obesity treatment plan along with follow-up of her obesity related diagnoses. [name] is on keeping a food journal and adhering to recommended goals of 1100-1300 calories and 180+ grams protein and states she is following her eating plan approximately 85% of the time. [name] states she is not currently exercising.  Today's visit was #: 16 Starting weight: 242 lbs Starting date: 11/02/2019 Today's weight: 215 lbs Today's date: 11/27/2020 Total lbs lost to date: 27 Total lbs lost since last in-office visit: 1  Interim History: [name] had to go off Ozempic for about 10 days due to shortage but finally got it. She has an increase in indulgences with a close friend passing away and was staying within calorie budget and wasn't getting in as much protein. She has no plans for the upcoming few weeks, except trying to qualify for surgery.  Subjective:   1. Elevated random blood glucose level [name] is noticing an increase in hunger with increased dose of Ozempic. She has taken second dose of Ozempic.  2. Tachycardia Her initial EKG showed sinus tachycardia- pulse rate RRR.  Assessment/Plan:   1. Elevated random blood glucose level Refill and increase Ozempic to 1 mg weekly.  Refill- Semaglutide, 1 MG/DOSE, 4 MG/3ML SOPN; Inject 1 mg as directed once a week.  Dispense: 3 mL; Refill: 0  2. Tachycardia Follow up on heart rate at next appt.  3. Obesity with current BMI of 42.0  [name] is currently in the action stage of change. As such, her goal is to continue with weight loss efforts. She has agreed to keeping a food journal and adhering to recommended goals of 1100-1300 calories and 80+ grams protein.   Exercise goals: No exercise has been prescribed at this time.  Behavioral modification strategies: increasing lean protein intake, meal planning and cooking strategies, keeping healthy foods in the home, and  planning for success.  [name] has agreed to follow-up with our clinic in 4 weeks. She was informed of the importance of frequent follow-up visits to maximize her success with intensive lifestyle modifications for her multiple health conditions.   Objective:   Blood pressure 121/80, pulse (!) 121, temperature 98.4 F (36.9 C), height 5' (1.524 m), weight 215 lb (97.5 kg), SpO2 95 %. Body mass index is 41.99 kg/m.  General: Cooperative, alert, well developed, in no acute distress. HEENT: Conjunctivae and lids unremarkable. Cardiovascular: Regular rhythm.  Lungs: Normal work of breathing. Neurologic: No focal deficits.   Lab Results  Component Value Date   CREATININE 0.74 05/21/2020   BUN 17 05/21/2020   NA 140 05/21/2020   K 4.8 05/21/2020   CL 103 05/21/2020   CO2 22 05/21/2020   Lab Results  Component Value Date   ALT 13 05/21/2020   AST 17 05/21/2020   ALKPHOS 114 05/21/2020   BILITOT 1.0 05/21/2020   Lab Results  Component Value Date   HGBA1C 5.3 05/21/2020   HGBA1C 5.8 (H) 11/02/2019   Lab Results  Component Value Date   INSULIN 27.8 (H) 05/21/2020   INSULIN 33.0 (H) 11/02/2019   Lab Results  Component Value Date   TSH 2.610 11/02/2019   Lab Results  Component Value Date   CHOL 254 (H) 05/21/2020   HDL 82 05/21/2020   LDLCALC 150 (H) 05/21/2020   TRIG 128 05/21/2020   Lab Results  Component Value Date   VD25OH 78.9 11/02/2019   Lab Results  Component Value Date   WBC 9.7 11/02/2019   HGB 15.6 11/02/2019   HCT 45.4 11/02/2019   MCV 89 11/02/2019   PLT 368 11/02/2019   No results found for: IRON, TIBC, FERRITIN  Obesity Behavioral Intervention:   Approximately 15 minutes were spent on the discussion below.  ASK: We discussed the diagnosis of obesity with [name] today and [name] agreed to give [name] permission to discuss obesity behavioral modification therapy today.  ASSESS: [name] has the diagnosis of obesity and her BMI today is 42.0. [name]  is in the action stage of change.   ADVISE: [name] was educated on the multiple health risks of obesity as well as the benefit of weight loss to improve her health. She was advised of the need for long term treatment and the importance of lifestyle modifications to improve her current health and to decrease her risk of future health problems.  AGREE: Multiple dietary modification options and treatment options were discussed and [name] agreed to follow the recommendations documented in the above note.  ARRANGE: [name] was educated on the importance of frequent visits to treat obesity as outlined per CMS and USPSTF guidelines and agreed to schedule her next follow up appointment today.  Attestation Statements:   Reviewed by clinician on day of visit: allergies, medications, problem list, medical history, surgical history, family history, social history, and previous encounter notes.  [name], CMA, am acting as transcriptionist for Reuben Likes, MD.   I have reviewed the above documentation for accuracy and completeness, and I agree with the above. - Reuben Likes, MD

## 2020-12-25 ENCOUNTER — Other Ambulatory Visit: Payer: Self-pay

## 2020-12-25 ENCOUNTER — Encounter (INDEPENDENT_AMBULATORY_CARE_PROVIDER_SITE_OTHER): Payer: Self-pay | Admitting: Family Medicine

## 2020-12-25 ENCOUNTER — Ambulatory Visit (INDEPENDENT_AMBULATORY_CARE_PROVIDER_SITE_OTHER): Payer: Medicare PPO | Admitting: Family Medicine

## 2020-12-25 VITALS — BP 150/82 | HR 116 | Temp 97.9°F | Ht 60.0 in | Wt 211.0 lb

## 2020-12-25 DIAGNOSIS — I10 Essential (primary) hypertension: Secondary | ICD-10-CM

## 2020-12-25 DIAGNOSIS — Z6841 Body Mass Index (BMI) 40.0 and over, adult: Secondary | ICD-10-CM

## 2020-12-25 DIAGNOSIS — R7309 Other abnormal glucose: Secondary | ICD-10-CM | POA: Diagnosis not present

## 2020-12-25 MED ORDER — SEMAGLUTIDE (1 MG/DOSE) 4 MG/3ML ~~LOC~~ SOPN: 1.0000 mg | PEN_INJECTOR | SUBCUTANEOUS | 0 refills | Status: DC

## 2020-12-25 NOTE — Progress Notes (Signed)
Chief Complaint:   OBESITY [name] is here to discuss her progress with her obesity treatment plan along with follow-up of her obesity related diagnoses. [name] is on keeping a food journal and adhering to recommended goals of 1100-1300 calories and 80+ grams protein and states she is following her eating plan approximately 90% of the time. [name] states she is not currently exercising.  Today's visit was #: 17 Starting weight: 242 lbs Starting date: 11/02/2019 Today's weight: 211 lbs Today's date: 12/25/2020 Total lbs lost to date: 31 Total lbs lost since last in-office visit: 4  Interim History: Monasia had a friend pass away and she went to the service last week. Her daughter is also ill with COVID and so she has been gathering stuff to bring to her. Mentally, pt has been tired of journaling and keeping track. She is not sure if she is getting 90-95 grams of protein per day. She has been trying to stay within 1300 calories.  Subjective:   1. Essential hypertension BP slightly elevated today. Pt denies chest pain/chest pressure/headache.  2. Elevated random blood glucose level Yaffa is on Ozempic 1 mg- no change in doses. Her last A1c was 5.3 and insulin level 27.8.  Assessment/Plan:   1. Essential hypertension [name] is working on healthy weight loss and exercise to improve blood pressure control. We will watch for signs of hypotension as she continues her lifestyle modifications. Continue Norvasc.  2. Elevated random blood glucose level Continue Ozempic as directed.  Refill- Semaglutide, 1 MG/DOSE, 4 MG/3ML SOPN; Inject 1 mg as directed once a week.  Dispense: 3 mL; Refill: 0  3. Obesity with current BMI of 41.2  [name] is currently in the action stage of change. As such, her goal is to continue with weight loss efforts. She has agreed to keeping a food journal and adhering to recommended goals of 1100-1300 calories and 80+ grams protein.   Exercise goals: No exercise has  been prescribed at this time. Pt is waiting for joint replacement.  Behavioral modification strategies: increasing lean protein intake, meal planning and cooking strategies, and keeping a strict food journal.  [name] has agreed to follow-up with our clinic in 4 weeks. She was informed of the importance of frequent follow-up visits to maximize her success with intensive lifestyle modifications for her multiple health conditions.   Objective:   Pulse (!) 116, temperature 97.9 F (36.6 C), height 5' (1.524 m), weight 211 lb (95.7 kg), SpO2 96 %. Body mass index is 41.21 kg/m.  General: Cooperative, alert, well developed, in no acute distress. HEENT: Conjunctivae and lids unremarkable. Cardiovascular: Regular rhythm.  Lungs: Normal work of breathing. Neurologic: No focal deficits.   Lab Results  Component Value Date   CREATININE 0.74 05/21/2020   BUN 17 05/21/2020   NA 140 05/21/2020   K 4.8 05/21/2020   CL 103 05/21/2020   CO2 22 05/21/2020   Lab Results  Component Value Date   ALT 13 05/21/2020   AST 17 05/21/2020   ALKPHOS 114 05/21/2020   BILITOT 1.0 05/21/2020   Lab Results  Component Value Date   HGBA1C 5.3 05/21/2020   HGBA1C 5.8 (H) 11/02/2019   Lab Results  Component Value Date   INSULIN 27.8 (H) 05/21/2020   INSULIN 33.0 (H) 11/02/2019   Lab Results  Component Value Date   TSH 2.610 11/02/2019   Lab Results  Component Value Date   CHOL 254 (H) 05/21/2020   HDL 82 05/21/2020  LDLCALC 150 (H) 05/21/2020   TRIG 128 05/21/2020   Lab Results  Component Value Date   VD25OH 78.9 11/02/2019   Lab Results  Component Value Date   WBC 9.7 11/02/2019   HGB 15.6 11/02/2019   HCT 45.4 11/02/2019   MCV 89 11/02/2019   PLT 368 11/02/2019   No results found for: IRON, TIBC, FERRITIN  Obesity Behavioral Intervention:   Approximately 15 minutes were spent on the discussion below.  ASK: We discussed the diagnosis of obesity with [name] today and [name]  agreed to give [name] permission to discuss obesity behavioral modification therapy today.  ASSESS: [name] has the diagnosis of obesity and her BMI today is 41.2. [name] is in the action stage of change.   ADVISE: [name] was educated on the multiple health risks of obesity as well as the benefit of weight loss to improve her health. She was advised of the need for long term treatment and the importance of lifestyle modifications to improve her current health and to decrease her risk of future health problems.  AGREE: Multiple dietary modification options and treatment options were discussed and [name] agreed to follow the recommendations documented in the above note.  ARRANGE: [name] was educated on the importance of frequent visits to treat obesity as outlined per CMS and USPSTF guidelines and agreed to schedule her next follow up appointment today.  Attestation Statements:   Reviewed by clinician on day of visit: allergies, medications, problem list, medical history, surgical history, family history, social history, and previous encounter notes.  Edmund Hilda, CMA, am acting as transcriptionist for Reuben Likes, MD.   I have reviewed the above documentation for accuracy and completeness, and I agree with the above. - Reuben Likes, MD

## 2021-01-22 ENCOUNTER — Encounter (INDEPENDENT_AMBULATORY_CARE_PROVIDER_SITE_OTHER): Payer: Self-pay | Admitting: Family Medicine

## 2021-01-22 ENCOUNTER — Ambulatory Visit (INDEPENDENT_AMBULATORY_CARE_PROVIDER_SITE_OTHER): Payer: Medicare PPO | Admitting: Family Medicine

## 2021-01-22 ENCOUNTER — Other Ambulatory Visit: Payer: Self-pay

## 2021-01-22 VITALS — BP 117/81 | HR 123 | Temp 98.1°F | Ht 60.0 in | Wt 209.0 lb

## 2021-01-22 DIAGNOSIS — Z6841 Body Mass Index (BMI) 40.0 and over, adult: Secondary | ICD-10-CM | POA: Diagnosis not present

## 2021-01-22 DIAGNOSIS — R7309 Other abnormal glucose: Secondary | ICD-10-CM

## 2021-01-22 DIAGNOSIS — E7849 Other hyperlipidemia: Secondary | ICD-10-CM

## 2021-01-22 MED ORDER — SEMAGLUTIDE (1 MG/DOSE) 4 MG/3ML ~~LOC~~ SOPN
1.0000 mg | PEN_INJECTOR | SUBCUTANEOUS | 0 refills | Status: DC
Start: 2021-01-22 — End: 2021-02-18

## 2021-01-22 NOTE — Progress Notes (Signed)
Chief Complaint:   OBESITY Addysin is here to discuss her progress with her obesity treatment plan along with follow-up of her obesity related diagnoses. Irisa is on keeping a food journal and adhering to recommended goals of 1100-1300 calories and 80+ grams protein and states she is following her eating plan approximately 90-95% of the time. Jamiracle states she is not currently exercising.  Today's visit was #: 18 Starting weight: 242 lbs Starting date: 11/02/2019 Today's weight: 209 lbs Today's date: 01/22/2021 Total lbs lost to date: 33 Total lbs lost since last in-office visit: 2  Interim History: 3 of 4 weeks have been out of the norm for Kriya due to colonoscopy and then significant URI for a couple of weeks. She hasn't called ortho yet but got booster for COVID and flu shot in preparation. She has not been eating as many salads and has had a few indulgent additions but has not noticed the same satisfaction from things like potatoes.  Subjective:   1. Elevated random blood glucose level Kena is on Ozempic 1 mg and had some nausea with cough during URI. Her A1c was within normal limits on last draw and insulin level 27.8.  2. Other hyperlipidemia Pt's last LDL was 150, HDL 82, and triglycerides 379. She is not on statin therapy.  Assessment/Plan:   1. Elevated random blood glucose level Continue Ozempic 1 mg as directed.  Refill- Semaglutide, 1 MG/DOSE, 4 MG/3ML SOPN; Inject 1 mg as directed once a week.  Dispense: 3 mL; Refill: 0  2. Other hyperlipidemia Cardiovascular risk and specific lipid/LDL goals reviewed.  We discussed several lifestyle modifications today and Dezirea will continue to work on diet, exercise and weight loss efforts. Orders and follow up as documented in patient record. Check labs at next appt.  Counseling Intensive lifestyle modifications are the first line treatment for this issue. Dietary changes: Increase soluble fiber. Decrease simple  carbohydrates. Exercise changes: Moderate to vigorous-intensity aerobic activity 150 minutes per week if tolerated. Lipid-lowering medications: see documented in medical record.  3. Obesity with current BMI of 40.8  Ashritha is currently in the action stage of change. As such, her goal is to continue with weight loss efforts. She has agreed to keeping a food journal and adhering to recommended goals of 1100-1300 calories and 80+ grams protein.   Exercise goals: All adults should avoid inactivity. Some physical activity is better than none, and adults who participate in any amount of physical activity gain some health benefits.  Behavioral modification strategies: increasing lean protein intake, meal planning and cooking strategies, keeping healthy foods in the home, and planning for success.  Sole has agreed to follow-up with our clinic in 4 weeks. She was informed of the importance of frequent follow-up visits to maximize her success with intensive lifestyle modifications for her multiple health conditions.   Objective:   Blood pressure 117/81, pulse (!) 123, temperature 98.1 F (36.7 C), height 5' (1.524 m), weight 209 lb (94.8 kg), SpO2 97 %. Body mass index is 40.82 kg/m.  General: Cooperative, alert, well developed, in no acute distress. HEENT: Conjunctivae and lids unremarkable. Cardiovascular: Regular rhythm.  Lungs: Normal work of breathing. Neurologic: No focal deficits.   Lab Results  Component Value Date   CREATININE 0.74 05/21/2020   BUN 17 05/21/2020   NA 140 05/21/2020   K 4.8 05/21/2020   CL 103 05/21/2020   CO2 22 05/21/2020   Lab Results  Component Value Date   ALT 13  05/21/2020   AST 17 05/21/2020   ALKPHOS 114 05/21/2020   BILITOT 1.0 05/21/2020   Lab Results  Component Value Date   HGBA1C 5.3 05/21/2020   HGBA1C 5.8 (H) 11/02/2019   Lab Results  Component Value Date   INSULIN 27.8 (H) 05/21/2020   INSULIN 33.0 (H) 11/02/2019   Lab Results   Component Value Date   TSH 2.610 11/02/2019   Lab Results  Component Value Date   CHOL 254 (H) 05/21/2020   HDL 82 05/21/2020   LDLCALC 150 (H) 05/21/2020   TRIG 128 05/21/2020   Lab Results  Component Value Date   VD25OH 78.9 11/02/2019   Lab Results  Component Value Date   WBC 9.7 11/02/2019   HGB 15.6 11/02/2019   HCT 45.4 11/02/2019   MCV 89 11/02/2019   PLT 368 11/02/2019   No results found for: IRON, TIBC, FERRITIN  Obesity Behavioral Intervention:   Approximately 15 minutes were spent on the discussion below.  ASK: We discussed the diagnosis of obesity with Shavy today and Latonja agreed to give [name] permission to discuss obesity behavioral modification therapy today.  ASSESS: Kathleena has the diagnosis of obesity and her BMI today is 40.8. Meka is in the action stage of change.   ADVISE: Jennavieve was educated on the multiple health risks of obesity as well as the benefit of weight loss to improve her health. She was advised of the need for long term treatment and the importance of lifestyle modifications to improve her current health and to decrease her risk of future health problems.  AGREE: Multiple dietary modification options and treatment options were discussed and Cia agreed to follow the recommendations documented in the above note.  ARRANGE: Corryn was educated on the importance of frequent visits to treat obesity as outlined per CMS and USPSTF guidelines and agreed to schedule her next follow up appointment today.  Attestation Statements:   Reviewed by clinician on day of visit: allergies, medications, problem list, medical history, surgical history, family history, social history, and previous encounter notes.  Edmund Hilda, CMA, am acting as transcriptionist for Reuben Likes, MD.   I have reviewed the above documentation for accuracy and completeness, and I agree with the above. - Reuben Likes, MD

## 2021-02-18 ENCOUNTER — Encounter (INDEPENDENT_AMBULATORY_CARE_PROVIDER_SITE_OTHER): Payer: Self-pay | Admitting: Family Medicine

## 2021-02-18 ENCOUNTER — Ambulatory Visit (INDEPENDENT_AMBULATORY_CARE_PROVIDER_SITE_OTHER): Payer: Medicare PPO | Admitting: Family Medicine

## 2021-02-18 ENCOUNTER — Other Ambulatory Visit: Payer: Self-pay

## 2021-02-18 VITALS — BP 129/63 | HR 117 | Temp 97.8°F | Ht 60.0 in | Wt 205.0 lb

## 2021-02-18 DIAGNOSIS — Z6841 Body Mass Index (BMI) 40.0 and over, adult: Secondary | ICD-10-CM

## 2021-02-18 DIAGNOSIS — I10 Essential (primary) hypertension: Secondary | ICD-10-CM | POA: Diagnosis not present

## 2021-02-18 DIAGNOSIS — R7309 Other abnormal glucose: Secondary | ICD-10-CM

## 2021-02-18 MED ORDER — SEMAGLUTIDE (1 MG/DOSE) 4 MG/3ML ~~LOC~~ SOPN: 1.0000 mg | PEN_INJECTOR | SUBCUTANEOUS | 0 refills | Status: DC

## 2021-02-19 NOTE — Progress Notes (Signed)
Chief Complaint:   OBESITY [name] is here to discuss her progress with her obesity treatment plan along with follow-up of her obesity related diagnoses. [name] is on keeping a food journal and adhering to recommended goals of 1100-1300 calories and 80 grams protein and states she is following her eating plan approximately 85% of the time. [name] states she is not currently exercising.  Today's visit was #: 19 Starting weight: 242 lbs Starting date: 11/02/2019 Today's weight: 205 lbs Today's date: 02/18/2021 Total lbs lost to date: 37 Total lbs lost since last in-office visit: 4  Interim History: Pt has an appt with her orthopedic surgeon in Dec 1. She has gone out 4 times in the past month with a few birthday parties or events. She was concerned she may have overindulged. Pt has been more active in the past few weeks. Thanksgiving is at her house.  Subjective:   1. Elevated random blood glucose level Pt is doing well on Ozempic with no GI side effects. Her last A1c was 5.3 and insulin level 27.8.  2. Essential hypertension BP very well controlled. Pt denies chest pain/chest pressure/headache. She is on amlodipine 10 daily.  Assessment/Plan:   1. Elevated random blood glucose level Continue current treatment plan.  Refill- Semaglutide, 1 MG/DOSE, 4 MG/3ML SOPN; Inject 1 mg as directed once a week.  Dispense: 3 mL; Refill: 0  2. Essential hypertension [name] is working on healthy weight loss and exercise to improve blood pressure control. We will watch for signs of hypotension as she continues her lifestyle modifications. Continue amlodipine.  3. Obesity BMI today is 40  [name] is currently in the action stage of change. As such, her goal is to continue with weight loss efforts. She has agreed to keeping a food journal and adhering to recommended goals of 1100-1300 calories and 80+ grams protein.   Exercise goals: All adults should avoid inactivity. Some physical activity is  better than none, and adults who participate in any amount of physical activity gain some health benefits.  Behavioral modification strategies: increasing lean protein intake, meal planning and cooking strategies, keeping healthy foods in the home, holiday eating strategies , and planning for success.  [name] has agreed to follow-up with our clinic in 3-4 weeks. She was informed of the importance of frequent follow-up visits to maximize her success with intensive lifestyle modifications for her multiple health conditions.   Objective:   Blood pressure 129/63, pulse (!) 117, temperature 97.8 F (36.6 C), height 5' (1.524 m), weight 205 lb (93 kg), SpO2 97 %. Body mass index is 40.04 kg/m.  General: Cooperative, alert, well developed, in no acute distress. HEENT: Conjunctivae and lids unremarkable. Cardiovascular: Regular rhythm.  Lungs: Normal work of breathing. Neurologic: No focal deficits.   Lab Results  Component Value Date   CREATININE 0.74 05/21/2020   BUN 17 05/21/2020   NA 140 05/21/2020   K 4.8 05/21/2020   CL 103 05/21/2020   CO2 22 05/21/2020   Lab Results  Component Value Date   ALT 13 05/21/2020   AST 17 05/21/2020   ALKPHOS 114 05/21/2020   BILITOT 1.0 05/21/2020   Lab Results  Component Value Date   HGBA1C 5.3 05/21/2020   HGBA1C 5.8 (H) 11/02/2019   Lab Results  Component Value Date   INSULIN 27.8 (H) 05/21/2020   INSULIN 33.0 (H) 11/02/2019   Lab Results  Component Value Date   TSH 2.610 11/02/2019   Lab Results  Component Value  Date   CHOL 254 (H) 05/21/2020   HDL 82 05/21/2020   LDLCALC 150 (H) 05/21/2020   TRIG 128 05/21/2020   Lab Results  Component Value Date   VD25OH 78.9 11/02/2019   Lab Results  Component Value Date   WBC 9.7 11/02/2019   HGB 15.6 11/02/2019   HCT 45.4 11/02/2019   MCV 89 11/02/2019   PLT 368 11/02/2019    Attestation Statements:   Reviewed by clinician on day of visit: allergies, medications, problem  list, medical history, surgical history, family history, social history, and previous encounter notes.  Edmund Hilda, CMA, am acting as transcriptionist for Reuben Likes, MD.   I have reviewed the above documentation for accuracy and completeness, and I agree with the above. - Reuben Likes, MD

## 2021-02-28 DIAGNOSIS — M1612 Unilateral primary osteoarthritis, left hip: Secondary | ICD-10-CM | POA: Diagnosis not present

## 2021-02-28 DIAGNOSIS — M25552 Pain in left hip: Secondary | ICD-10-CM | POA: Diagnosis not present

## 2021-02-28 DIAGNOSIS — Z6841 Body Mass Index (BMI) 40.0 and over, adult: Secondary | ICD-10-CM | POA: Diagnosis not present

## 2021-03-12 DIAGNOSIS — H52223 Regular astigmatism, bilateral: Secondary | ICD-10-CM | POA: Diagnosis not present

## 2021-03-18 ENCOUNTER — Ambulatory Visit (INDEPENDENT_AMBULATORY_CARE_PROVIDER_SITE_OTHER): Payer: Medicare PPO | Admitting: Family Medicine

## 2021-03-18 ENCOUNTER — Encounter (INDEPENDENT_AMBULATORY_CARE_PROVIDER_SITE_OTHER): Payer: Self-pay | Admitting: Family Medicine

## 2021-03-18 ENCOUNTER — Other Ambulatory Visit: Payer: Self-pay

## 2021-03-18 VITALS — BP 160/80 | HR 113 | Temp 98.0°F | Ht 60.0 in | Wt 205.0 lb

## 2021-03-18 DIAGNOSIS — R7309 Other abnormal glucose: Secondary | ICD-10-CM | POA: Diagnosis not present

## 2021-03-18 DIAGNOSIS — Z6841 Body Mass Index (BMI) 40.0 and over, adult: Secondary | ICD-10-CM | POA: Diagnosis not present

## 2021-03-18 DIAGNOSIS — E559 Vitamin D deficiency, unspecified: Secondary | ICD-10-CM

## 2021-03-18 MED ORDER — SEMAGLUTIDE (1 MG/DOSE) 4 MG/3ML ~~LOC~~ SOPN: 1.0000 mg | PEN_INJECTOR | SUBCUTANEOUS | 0 refills | Status: DC

## 2021-03-19 NOTE — Progress Notes (Signed)
Chief Complaint:   OBESITY [name] is here to discuss her progress with her obesity treatment plan along with follow-up of her obesity related diagnoses. [name] is on keeping a food journal and adhering to recommended goals of 1100-1300 calories and 80+ grams protein and states she is following her eating plan approximately 90% of the time. [name] states she is not currently exercising.  Today's visit was #: 20 Starting weight: 242 lbs Starting date: 11/02/2019 Today's weight: 205 lbs Today's date: 03/18/2021 Total lbs lost to date: 37 Total lbs lost since last in-office visit: 0  Interim History: [name] is scheduled for surgery Feb 1 and needs BMI <=40. She did have some indulgent eating on Thanksgiving, but otherwise has been really on plan. She has no plans this week until Christmas Eve. Pt will have leftovers on Christmas Day. She is going to have a low key New Years. Pt denies hunger.  Subjective:   1. Elevated random blood glucose level Pt's last A1c was 5.3 with an insulin level of 27.8. She is not on meds. She reports better control of carb intake since initiation of program.  2. Vitamin D deficiency Pt's last Vit D level was 78.9. She is not taking Rx Vit D but is taking OTC Vit D 10,000 IU daily.  Assessment/Plan:   1. Elevated random blood glucose level Repeat labs in 1 months and continue Ozempic 1 mg.  Refill- Semaglutide, 1 MG/DOSE, 4 MG/3ML SOPN; Inject 1 mg as directed once a week.  Dispense: 3 mL; Refill: 0  2. Vitamin D deficiency Low Vitamin D level contributes to fatigue and are associated with obesity, breast, and colon cancer. She agrees to continue to take OTC Vitamin D 10,000 IU daily and will follow-up for routine testing of Vitamin D, at least 2-3 times per year to avoid over-replacement. Check labs in 1 month.  3. Obesity with current BMI is 40.2  [name] is currently in the action stage of change. As such, her goal is to continue with weight loss  efforts. She has agreed to keeping a food journal and adhering to recommended goals of 1100-1300 calories and 80+ grams protein.   Exercise goals: All adults should avoid inactivity. Some physical activity is better than none, and adults who participate in any amount of physical activity gain some health benefits.  Behavioral modification strategies: increasing lean protein intake, meal planning and cooking strategies, keeping healthy foods in the home, and planning for success.  [name] has agreed to follow-up with our clinic in 3-4 weeks. She was informed of the importance of frequent follow-up visits to maximize her success with intensive lifestyle modifications for her multiple health conditions.   Objective:   Blood pressure (!) 160/80, pulse (!) 113, temperature 98 F (36.7 C), height 5' (1.524 m), weight 205 lb (93 kg), SpO2 96 %. Body mass index is 40.04 kg/m.  General: Cooperative, alert, well developed, in no acute distress. HEENT: Conjunctivae and lids unremarkable. Cardiovascular: Regular rhythm.  Lungs: Normal work of breathing. Neurologic: No focal deficits.   Lab Results  Component Value Date   CREATININE 0.74 05/21/2020   BUN 17 05/21/2020   NA 140 05/21/2020   K 4.8 05/21/2020   CL 103 05/21/2020   CO2 22 05/21/2020   Lab Results  Component Value Date   ALT 13 05/21/2020   AST 17 05/21/2020   ALKPHOS 114 05/21/2020   BILITOT 1.0 05/21/2020   Lab Results  Component Value Date   HGBA1C  5.3 05/21/2020   HGBA1C 5.8 (H) 11/02/2019   Lab Results  Component Value Date   INSULIN 27.8 (H) 05/21/2020   INSULIN 33.0 (H) 11/02/2019   Lab Results  Component Value Date   TSH 2.610 11/02/2019   Lab Results  Component Value Date   CHOL 254 (H) 05/21/2020   HDL 82 05/21/2020   LDLCALC 150 (H) 05/21/2020   TRIG 128 05/21/2020   Lab Results  Component Value Date   VD25OH 78.9 11/02/2019   Lab Results  Component Value Date   WBC 9.7 11/02/2019   HGB 15.6  11/02/2019   HCT 45.4 11/02/2019   MCV 89 11/02/2019   PLT 368 11/02/2019   Attestation Statements:   Reviewed by clinician on day of visit: allergies, medications, problem list, medical history, surgical history, family history, social history, and previous encounter notes.  Edmund Hilda, CMA, am acting as transcriptionist for Reuben Likes, MD.  I have reviewed the above documentation for accuracy and completeness, and I agree with the above. - Reuben Likes, MD

## 2021-04-16 ENCOUNTER — Ambulatory Visit (INDEPENDENT_AMBULATORY_CARE_PROVIDER_SITE_OTHER): Payer: 59 | Admitting: Family Medicine

## 2021-04-23 ENCOUNTER — Telehealth (INDEPENDENT_AMBULATORY_CARE_PROVIDER_SITE_OTHER): Payer: Self-pay

## 2021-04-23 ENCOUNTER — Other Ambulatory Visit (INDEPENDENT_AMBULATORY_CARE_PROVIDER_SITE_OTHER): Payer: Self-pay | Admitting: Family Medicine

## 2021-04-23 DIAGNOSIS — R7309 Other abnormal glucose: Secondary | ICD-10-CM

## 2021-04-23 MED ORDER — SEMAGLUTIDE (1 MG/DOSE) 4 MG/3ML ~~LOC~~ SOPN: 1.0000 mg | PEN_INJECTOR | SUBCUTANEOUS | 0 refills | Status: DC

## 2021-04-23 NOTE — Telephone Encounter (Signed)
LAST APPOINTMENT DATE: 03/18/21 NEXT APPOINTMENT DATE: 05/21/21   [facility] # [address], [place] - [address] [place] [address] Phone: [phone] Fax: [phone]  Patient is requesting a refill of the following medications: No prescriptions requested or ordered in this encounter   Date last filled: 03/18/21 Previously prescribed by Dr Lawson Radar  Lab Results      Component                Value               Date                      HGBA1C                   5.3                 05/21/2020                HGBA1C                   5.8 (H)             11/02/2019           Lab Results      Component                Value               Date                      LDLCALC                  150 (H)             05/21/2020                CREATININE               0.74                05/21/2020           Lab Results      Component                Value               Date                      VD25OH                   78.9                11/02/2019            BP Readings from Last 3 Encounters: 03/18/21 : (!) 160/80 02/18/21 : 129/63 01/22/21 : 117/81

## 2021-04-23 NOTE — Telephone Encounter (Signed)
Rx sent to the pharmacy.

## 2021-04-23 NOTE — Telephone Encounter (Signed)
Pt called in and stated that she in a refill on her Ozempic sent to [place]. Please advise

## 2021-05-21 ENCOUNTER — Ambulatory Visit (INDEPENDENT_AMBULATORY_CARE_PROVIDER_SITE_OTHER): Payer: Medicare Other | Admitting: Family Medicine

## 2021-05-21 ENCOUNTER — Other Ambulatory Visit: Payer: Self-pay

## 2021-05-21 ENCOUNTER — Encounter (INDEPENDENT_AMBULATORY_CARE_PROVIDER_SITE_OTHER): Payer: Self-pay | Admitting: Family Medicine

## 2021-05-21 VITALS — BP 111/72 | HR 120 | Temp 97.9°F | Ht 60.0 in | Wt 194.0 lb

## 2021-05-21 DIAGNOSIS — E559 Vitamin D deficiency, unspecified: Secondary | ICD-10-CM

## 2021-05-21 DIAGNOSIS — Z6837 Body mass index (BMI) 37.0-37.9, adult: Secondary | ICD-10-CM

## 2021-05-21 DIAGNOSIS — E669 Obesity, unspecified: Secondary | ICD-10-CM | POA: Diagnosis not present

## 2021-05-21 DIAGNOSIS — Z6841 Body Mass Index (BMI) 40.0 and over, adult: Secondary | ICD-10-CM

## 2021-05-21 DIAGNOSIS — R7309 Other abnormal glucose: Secondary | ICD-10-CM | POA: Diagnosis not present

## 2021-05-21 MED ORDER — OZEMPIC (0.25 OR 0.5 MG/DOSE) 2 MG/1.5ML ~~LOC~~ SOPN: 0.5000 mg | PEN_INJECTOR | SUBCUTANEOUS | 0 refills | Status: DC

## 2021-05-21 NOTE — Progress Notes (Signed)
Chief Complaint:   OBESITY [name] is here to discuss her progress with her obesity treatment plan along with follow-up of her obesity related diagnoses. [name] is on keeping a food journal and adhering to recommended goals of 1100-1300 calories and 80+ grams protein and states she is following her eating plan approximately 100% of the time. [name] states she is walking and doing PT 20-45 minutes 2-7 times per week.  Today's visit was #: 21 Starting weight: 242 lbs Starting date: 11/02/2019 Today's weight: 194 lbs Today's date: 05/21/2021 Total lbs lost to date: 48 Total lbs lost since last in-office visit: 11  Interim History: Pt had hip replacement surgery February 1st. She did have significant GI issues post-op- vomiting, then constipation. She is noticing constipation has yet to really improve. Pt would like to pull herself to staying more on plan in terms of quantity. She is not necessarily journaling but is aware of what she is taking in.  Subjective:   1. Elevated random blood glucose level [name]'s last A1c was 5.3 with an insulin level of 27.8 last year. She has had significant GI side effects since surgery.  2. Vitamin D deficiency [name] denies nausea, vomiting, and muscle weakness but notes fatigue. Her last Vit D level was 133.0 on 04/11/2021.  Assessment/Plan:   1. Elevated random blood glucose level Decrease Ozempic to 0.5 mg as prescribed.  Decrease & Refill- Semaglutide,0.25 or 0.5MG /DOS, (OZEMPIC, 0.25 OR 0.5 MG/DOSE,) 2 MG/1.5ML SOPN; Inject 0.5 mg into the skin once a week.  Dispense: 1.5 mL; Refill: 0  2. Vitamin D deficiency Low Vitamin D level contributes to fatigue and are associated with obesity, breast, and colon cancer. She will follow-up for routine testing of Vitamin D, at least 2-3 times per year to avoid over-replacement. F/u labs in May.  3. Obesity with current BMI is 37.9 [name] is currently in the action stage of change. As such, her goal is to  continue with weight loss efforts. She has agreed to keeping a food journal and adhering to recommended goals of 1100-1300 calories and 80+ grams protein.   Exercise goals:  As is  Behavioral modification strategies: increasing lean protein intake, meal planning and cooking strategies, planning for success, and keeping a strict food journal.  [name] has agreed to follow-up with our clinic in 4 weeks. She was informed of the importance of frequent follow-up visits to maximize her success with intensive lifestyle modifications for her multiple health conditions.   Objective:   Blood pressure 111/72, pulse (!) 120, temperature 97.9 F (36.6 C), height 5' (1.524 m), weight 194 lb (88 kg), SpO2 98 %. Body mass index is 37.89 kg/m.  General: Cooperative, alert, well developed, in no acute distress. HEENT: Conjunctivae and lids unremarkable. Cardiovascular: Regular rhythm.  Lungs: Normal work of breathing. Neurologic: No focal deficits.   Lab Results  Component Value Date   CREATININE 0.74 05/21/2020   BUN 17 05/21/2020   NA 140 05/21/2020   K 4.8 05/21/2020   CL 103 05/21/2020   CO2 22 05/21/2020   Lab Results  Component Value Date   ALT 13 05/21/2020   AST 17 05/21/2020   ALKPHOS 114 05/21/2020   BILITOT 1.0 05/21/2020   Lab Results  Component Value Date   HGBA1C 5.3 05/21/2020   HGBA1C 5.8 (H) 11/02/2019   Lab Results  Component Value Date   INSULIN 27.8 (H) 05/21/2020   INSULIN 33.0 (H) 11/02/2019   Lab Results  Component Value Date  TSH 2.610 11/02/2019   Lab Results  Component Value Date   CHOL 254 (H) 05/21/2020   HDL 82 05/21/2020   LDLCALC 150 (H) 05/21/2020   TRIG 128 05/21/2020   Lab Results  Component Value Date   VD25OH 78.9 11/02/2019   Lab Results  Component Value Date   WBC 9.7 11/02/2019   HGB 15.6 11/02/2019   HCT 45.4 11/02/2019   MCV 89 11/02/2019   PLT 368 11/02/2019    Attestation Statements:   Reviewed by clinician on day of  visit: allergies, medications, problem list, medical history, surgical history, family history, social history, and previous encounter notes.  Coral Ceo, CMA, am acting as transcriptionist for [name], MD.   I have reviewed the above documentation for accuracy and completeness, and I agree with the above. - [name], MD

## 2021-06-18 ENCOUNTER — Encounter (INDEPENDENT_AMBULATORY_CARE_PROVIDER_SITE_OTHER): Payer: Self-pay

## 2021-06-18 ENCOUNTER — Ambulatory Visit (INDEPENDENT_AMBULATORY_CARE_PROVIDER_SITE_OTHER): Payer: 59 | Admitting: Family Medicine

## 2021-06-18 ENCOUNTER — Encounter (INDEPENDENT_AMBULATORY_CARE_PROVIDER_SITE_OTHER): Payer: Self-pay | Admitting: Family Medicine

## 2021-06-18 ENCOUNTER — Other Ambulatory Visit: Payer: Self-pay

## 2021-06-19 ENCOUNTER — Encounter (INDEPENDENT_AMBULATORY_CARE_PROVIDER_SITE_OTHER): Payer: Self-pay | Admitting: Family Medicine

## 2021-06-19 ENCOUNTER — Ambulatory Visit (INDEPENDENT_AMBULATORY_CARE_PROVIDER_SITE_OTHER): Payer: Medicare Other | Admitting: Family Medicine

## 2021-06-19 VITALS — BP 105/70 | HR 115 | Temp 97.7°F | Ht 60.0 in | Wt 192.0 lb

## 2021-06-19 DIAGNOSIS — Z6837 Body mass index (BMI) 37.0-37.9, adult: Secondary | ICD-10-CM

## 2021-06-19 DIAGNOSIS — R7309 Other abnormal glucose: Secondary | ICD-10-CM

## 2021-06-19 DIAGNOSIS — I10 Essential (primary) hypertension: Secondary | ICD-10-CM

## 2021-06-19 DIAGNOSIS — E669 Obesity, unspecified: Secondary | ICD-10-CM | POA: Diagnosis not present

## 2021-06-21 NOTE — Progress Notes (Signed)
? ? ? ?Chief Complaint:  ? ?OBESITY ?[name] is here to discuss her progress with her obesity treatment plan along with follow-up of her obesity related diagnoses. [name] is on keeping a food journal and adhering to recommended goals of 1100 to 1300 calories and 80+ protein and states she is following her eating plan approximately 90% of the time. [name] states she is not exercising. ? ?Today's visit was #: 22 ?Starting weight: 242 lbs ?Starting date: 11/02/2019 ?Today's weight: 192 lbs ?Today's date: 06/19/2021 ?Total lbs lost to date: 63 ?Total lbs lost since last in-office visit: 2 ? ?Interim History: [name] voices that she had her physical yesterday. She went off Ozempic for 2 weeks to try to regulate her bowels. After 2 weeks, her bowel regularity returned and so she has been able to restart. [name] denies GI side effects when off Ozempic. She may have eaten more carbs. [name] is starting tai chi tomorrow and would like to walk the track after tai chi. ? ?Subjective:  ? ?1. Essential hypertension ?[name] is on amlodipine and her blood pressure is well controlled today. She denies chest pain, chest pressure, or headaches. ? ?2. Elevated random blood glucose level ?[name] is on Ozempic with some constipation that is residual after surgery. ? ?Assessment/Plan:  ? ?1. Essential hypertension ?[name] agrees to continue her current medication with no change in dose or medication. ? ?2. Elevated random blood glucose level ?[name] agrees to continue her Ozempic with no change in her dose. ? ?3. Obesity with current BMI is 37.5 ?[name] is currently in the action stage of change. As such, her goal is to continue with weight loss efforts. She has agreed to keeping a food journal and adhering to recommended goals of 1100 to 1300 calories and 80+ protein.  ? ?Exercise goals: All adults should avoid inactivity. Some physical activity is better than none, and adults who participate in any amount of physical activity gain some  health benefits. ? ?Behavioral modification strategies: increasing lean protein intake, meal planning and cooking strategies, keeping healthy foods in the home, and planning for success. ? ?[name] has agreed to follow-up with our clinic in 4 weeks. She was informed of the importance of frequent follow-up visits to maximize her success with intensive lifestyle modifications for her multiple health conditions.  ? ?Objective:  ? ?Blood pressure 105/70, pulse (!) 115, temperature 97.7 ?F (36.5 ?C), height 5' (1.524 m), weight 192 lb (87.1 kg), SpO2 98 %. ?Body mass index is 37.5 kg/m?. ? ?General: Cooperative, alert, well developed, in no acute distress. ?HEENT: Conjunctivae and lids unremarkable. ?Cardiovascular: Regular rhythm.  ?Lungs: Normal work of breathing. ?Neurologic: No focal deficits.  ? ?Lab Results  ?Component Value Date  ? CREATININE 0.74 05/21/2020  ? BUN 17 05/21/2020  ? NA 140 05/21/2020  ? K 4.8 05/21/2020  ? CL 103 05/21/2020  ? CO2 22 05/21/2020  ? ?Lab Results  ?Component Value Date  ? ALT 13 05/21/2020  ? AST 17 05/21/2020  ? ALKPHOS 114 05/21/2020  ? BILITOT 1.0 05/21/2020  ? ?Lab Results  ?Component Value Date  ? HGBA1C 5.3 05/21/2020  ? HGBA1C 5.8 (H) 11/02/2019  ? ?Lab Results  ?Component Value Date  ? INSULIN 27.8 (H) 05/21/2020  ? INSULIN 33.0 (H) 11/02/2019  ? ?Lab Results  ?Component Value Date  ? TSH 2.610 11/02/2019  ? ?Lab Results  ?Component Value Date  ? CHOL 254 (H) 05/21/2020  ? HDL 82 05/21/2020  ? [name] 150 (H) 05/21/2020  ?  TRIG 128 05/21/2020  ? ?Lab Results  ?Component Value Date  ? VD25OH 78.9 11/02/2019  ? ?Lab Results  ?Component Value Date  ? WBC 9.7 11/02/2019  ? HGB 15.6 11/02/2019  ? HCT 45.4 11/02/2019  ? MCV 89 11/02/2019  ? PLT 368 11/02/2019  ? ?No results found for: IRON, TIBC, FERRITIN ? ?Obesity Behavioral Intervention:  ? ?Approximately 15 minutes were spent on the discussion below. ? ?ASK: ?We discussed the diagnosis of obesity with [name] today and [name]  agreed to give [name] permission to discuss obesity behavioral modification therapy today. ? ?ASSESS: ?[name] has the diagnosis of obesity and her BMI today is 37.5. [name] is in the action stage of change.  ? ?ADVISE: ?[name] was educated on the multiple health risks of obesity as well as the benefit of weight loss to improve her health. She was advised of the need for long term treatment and the importance of lifestyle modifications to improve her current health and to decrease her risk of future health problems. ? ?AGREE: ?Multiple dietary modification options and treatment options were discussed and [name] agreed to follow the recommendations documented in the above note. ? ?ARRANGE: ?[name] was educated on the importance of frequent visits to treat obesity as outlined per CMS and USPSTF guidelines and agreed to schedule her next follow up appointment today. ? ?Attestation Statements:  ? ?Reviewed by clinician on day of visit: allergies, medications, problem list, medical history, surgical history, family history, social history, and previous encounter notes. ? ?I, Marcille Blanco, CMA, am acting as transcriptionist for [name], MD ? ?I have reviewed the above documentation for accuracy and completeness, and I agree with the above. [name], MD ? ?

## 2021-07-16 ENCOUNTER — Encounter (INDEPENDENT_AMBULATORY_CARE_PROVIDER_SITE_OTHER): Payer: Self-pay | Admitting: Family Medicine

## 2021-07-16 ENCOUNTER — Ambulatory Visit (INDEPENDENT_AMBULATORY_CARE_PROVIDER_SITE_OTHER): Payer: Medicare Other | Admitting: Family Medicine

## 2021-07-16 VITALS — BP 110/78 | HR 119 | Temp 98.1°F | Ht 60.0 in | Wt 192.0 lb

## 2021-07-16 DIAGNOSIS — I10 Essential (primary) hypertension: Secondary | ICD-10-CM | POA: Diagnosis not present

## 2021-07-16 DIAGNOSIS — R7309 Other abnormal glucose: Secondary | ICD-10-CM | POA: Diagnosis not present

## 2021-07-16 DIAGNOSIS — Z6837 Body mass index (BMI) 37.0-37.9, adult: Secondary | ICD-10-CM | POA: Diagnosis not present

## 2021-07-16 DIAGNOSIS — E669 Obesity, unspecified: Secondary | ICD-10-CM | POA: Diagnosis not present

## 2021-07-16 MED ORDER — OZEMPIC (0.25 OR 0.5 MG/DOSE) 2 MG/1.5ML ~~LOC~~ SOPN: 0.5000 mg | PEN_INJECTOR | SUBCUTANEOUS | 0 refills | Status: DC

## 2021-07-24 NOTE — Progress Notes (Signed)
Chief Complaint:   OBESITY [name] is here to discuss her progress with her obesity treatment plan along with follow-up of her obesity related diagnoses. [name] is on keeping a food journal and adhering to recommended goals of 1,100 to 1,300 calories and 80 grams of protein and states she is following her eating plan approximately 90% of the time. [name] states she is doing Tai Chi for 60 minutes 2 times per week and has been more active and cleaning.  Today's visit was #: 23 Starting weight: 242 lbs Starting date: 11/02/2019 Today's weight: 192 lbs Today's date: 07/16/2021 Total lbs lost to date: 50 Total lbs lost since last in-office visit: 0  Interim History: [name] is feeling really good. She is going up and down stairs, doing Tai Chi, and cleaning. [name] recognizes that she has been approximately the same calorically, but doesn't think she is aware of protein intake. She would like to increase activity to 3 times per week of [place] or 1 day of swimming.  Subjective:   1. Essential hypertension [name]'s blood pressure is controlled today. She denies chest pressure, chest pain, or headache.  2. Elevated random blood glucose level [name] is doing well on Ozempic and denies GI side effects. She has noticed an increase in her carb intake, but not necessarily an increase in calorie intake.  Assessment/Plan:   1. Essential hypertension [name] agrees to continue taking amlodipine and may consider decreasing the dose if her blood pressure stays controlled. [name] is working on healthy weight loss and exercise to improve blood pressure control. We will watch for signs of hypotension as she continues her lifestyle modifications.  2. Elevated random blood glucose level [name] agrees to continue taking Ozempic and will follow up at the agreed upon time.  - Semaglutide,0.25 or 0.5MG/DOS, (OZEMPIC, 0.25 OR 0.5 MG/DOSE,) 2 MG/1.5ML SOPN; Inject 0.5 mg into the skin once a week.  Dispense:  1.5 mL; Refill: 0  3. Obesity with current BMI is 37.5 [name] is currently in the action stage of change. As such, her goal is to continue with weight loss efforts. She has agreed to keeping a food journal and adhering to recommended goals of 1,100 to 1,300 calories and 80+ grams of protein.   Exercise goals: All adults should avoid inactivity. Some physical activity is better than none, and adults who participate in any amount of physical activity gain some health benefits.  Behavioral modification strategies: increasing lean protein intake, meal planning and cooking strategies, keeping healthy foods in the home, and planning for success.  [name] has agreed to follow-up with our clinic in 5 weeks. She was informed of the importance of frequent follow-up visits to maximize her success with intensive lifestyle modifications for her multiple health conditions.   Objective:   Blood pressure 110/78, pulse (!) 119, temperature 98.1 F (36.7 C), height 5' (1.524 m), weight 192 lb (87.1 kg), SpO2 96 %. Body mass index is 37.5 kg/m.  General: Cooperative, alert, well developed, in no acute distress. HEENT: Conjunctivae and lids unremarkable. Cardiovascular: Regular rhythm.  Lungs: Normal work of breathing. Neurologic: No focal deficits.   Lab Results  Component Value Date   CREATININE 0.74 05/21/2020   BUN 17 05/21/2020   NA 140 05/21/2020   K 4.8 05/21/2020   CL 103 05/21/2020   CO2 22 05/21/2020   Lab Results  Component Value Date   ALT 13 05/21/2020   AST 17 05/21/2020   ALKPHOS 114 05/21/2020   BILITOT  1.0 05/21/2020   Lab Results  Component Value Date   HGBA1C 5.3 05/21/2020   HGBA1C 5.8 (H) 11/02/2019   Lab Results  Component Value Date   INSULIN 27.8 (H) 05/21/2020   INSULIN 33.0 (H) 11/02/2019   Lab Results  Component Value Date   TSH 2.610 11/02/2019   Lab Results  Component Value Date   CHOL 254 (H) 05/21/2020   HDL 82 05/21/2020   LDLCALC 150 (H)  05/21/2020   TRIG 128 05/21/2020   Lab Results  Component Value Date   VD25OH 78.9 11/02/2019   Lab Results  Component Value Date   WBC 9.7 11/02/2019   HGB 15.6 11/02/2019   HCT 45.4 11/02/2019   MCV 89 11/02/2019   PLT 368 11/02/2019   No results found for: IRON, TIBC, FERRITIN  Obesity Behavioral Intervention:   Approximately 15 minutes were spent on the discussion below.  ASK: We discussed the diagnosis of obesity with [name] today and [name] agreed to give [name] permission to discuss obesity behavioral modification therapy today.  ASSESS: [name] has the diagnosis of obesity and her BMI today is 37.5. [name] is in the action stage of change.   ADVISE: [name] was educated on the multiple health risks of obesity as well as the benefit of weight loss to improve her health. She was advised of the need for long term treatment and the importance of lifestyle modifications to improve her current health and to decrease her risk of future health problems.  AGREE: Multiple dietary modification options and treatment options were discussed and [name] agreed to follow the recommendations documented in the above note.  ARRANGE: [name] was educated on the importance of frequent visits to treat obesity as outlined per CMS and USPSTF guidelines and agreed to schedule her next follow up appointment today.  Attestation Statements:   Reviewed by clinician on day of visit: allergies, medications, problem list, medical history, surgical history, family history, social history, and previous encounter notes.  IMarcille Blanco, CMA, am acting as transcriptionist for [name], MD I have reviewed the above documentation for accuracy and completeness, and I agree with the above. - [name], MD

## 2021-08-14 ENCOUNTER — Ambulatory Visit (INDEPENDENT_AMBULATORY_CARE_PROVIDER_SITE_OTHER): Payer: Medicare Other | Admitting: Family Medicine

## 2021-08-14 ENCOUNTER — Encounter (INDEPENDENT_AMBULATORY_CARE_PROVIDER_SITE_OTHER): Payer: Self-pay | Admitting: Family Medicine

## 2021-08-14 VITALS — BP 114/68 | HR 116 | Temp 98.0°F | Ht 60.0 in | Wt 189.0 lb

## 2021-08-14 DIAGNOSIS — E669 Obesity, unspecified: Secondary | ICD-10-CM | POA: Diagnosis not present

## 2021-08-14 DIAGNOSIS — Z6837 Body mass index (BMI) 37.0-37.9, adult: Secondary | ICD-10-CM | POA: Diagnosis not present

## 2021-08-14 DIAGNOSIS — I10 Essential (primary) hypertension: Secondary | ICD-10-CM | POA: Diagnosis not present

## 2021-08-14 DIAGNOSIS — R7309 Other abnormal glucose: Secondary | ICD-10-CM

## 2021-08-17 NOTE — Progress Notes (Signed)
Chief Complaint:   OBESITY [name] is here to discuss her progress with her obesity treatment plan along with follow-up of her obesity related diagnoses. [name] is on keeping a food journal and adhering to recommended goals of 1100-1300 calories and 80+ protein and states she is following her eating plan approximately 75% of the time. [name] states she is has been walking 1-1/2 miles and doing Tai Chi 60 minutes 2 times per week.  Today's visit was #: 24 Starting weight: 242 lbs Starting date: 11/02/2019 Today's weight: 189 lbs Today's date: 08/14/2021 Total lbs lost to date: 53 lbs Total lbs lost since last in-office visit: 3  Interim History: [name] was out and about more over the last 4 weeks.  She did realize she felt more achy with and increase in sugar intake.  During that time, she stopped Ozempic. [name] has cut sugar out and felt significantly better.  She is now back on meal plan and doing more for her.  She is feeling better now.  She has quite a few birthdays coming up.  She shared a poem she wrote about her favorite food today.   Subjective:   1. Elevated random blood glucose level [name] is on Ozempic with GI side effects.  [name] has good recognition that food is fuel and not causation of happiness.    2. Essential hypertension [name]'s blood pressure is well controlled.  [name] denies any chest pain, chest pressure, or headaches.   Assessment/Plan:   1. Elevated random blood glucose level [name] has agreed to continue Ozempic at current dose with no change in dose.   2. Essential hypertension [name] has agreed to continue amlodipine, if blood pressure is still controlled, decrease amlodipine.   3. Obesity with current BMI is 37.0 [name] is currently in the action stage of change. As such, her goal is to continue with weight loss efforts. She has agreed to keeping a food journal and adhering to recommended goals of 1100-1300 calories and 80+ protein  daily.  Exercise goals:  As is.   Behavioral modification strategies: increasing lean protein intake, meal planning and cooking strategies, and keeping healthy foods in the home.  [name] has agreed to follow-up with our clinic in 6 weeks. She was informed of the importance of frequent follow-up visits to maximize her success with intensive lifestyle modifications for her multiple health conditions.   Objective:   Blood pressure 114/68, pulse (!) 116, temperature 98 F (36.7 C), height 5' (1.524 m), weight 189 lb (85.7 kg), SpO2 97 %. Body mass index is 36.91 kg/m.  General: Cooperative, alert, well developed, in no acute distress. HEENT: Conjunctivae and lids unremarkable. Cardiovascular: Regular rhythm.  Lungs: Normal work of breathing. Neurologic: No focal deficits.   Lab Results  Component Value Date   CREATININE 0.74 05/21/2020   BUN 17 05/21/2020   NA 140 05/21/2020   K 4.8 05/21/2020   CL 103 05/21/2020   CO2 22 05/21/2020   Lab Results  Component Value Date   ALT 13 05/21/2020   AST 17 05/21/2020   ALKPHOS 114 05/21/2020   BILITOT 1.0 05/21/2020   Lab Results  Component Value Date   HGBA1C 5.3 05/21/2020   HGBA1C 5.8 (H) 11/02/2019   Lab Results  Component Value Date   INSULIN 27.8 (H) 05/21/2020   INSULIN 33.0 (H) 11/02/2019   Lab Results  Component Value Date   TSH 2.610 11/02/2019   Lab Results  Component Value Date   CHOL 254 (  H) 05/21/2020   HDL 82 05/21/2020   LDLCALC 150 (H) 05/21/2020   TRIG 128 05/21/2020   Lab Results  Component Value Date   VD25OH 78.9 11/02/2019   Lab Results  Component Value Date   WBC 9.7 11/02/2019   HGB 15.6 11/02/2019   HCT 45.4 11/02/2019   MCV 89 11/02/2019   PLT 368 11/02/2019   No results found for: IRON, TIBC, FERRITIN  Attestation Statements:   Reviewed by clinician on day of visit: allergies, medications, problem list, medical history, surgical history, family history, social history, and  previous encounter notes. I, Davy Pique, RMA , am acting as transcriptionist for [name], MD.  I have reviewed the above documentation for accuracy and completeness, and I agree with the above. - [name], MD

## 2021-09-24 ENCOUNTER — Ambulatory Visit (INDEPENDENT_AMBULATORY_CARE_PROVIDER_SITE_OTHER): Payer: Medicare Other | Admitting: Family Medicine

## 2021-09-24 ENCOUNTER — Encounter (INDEPENDENT_AMBULATORY_CARE_PROVIDER_SITE_OTHER): Payer: Self-pay | Admitting: Family Medicine

## 2021-09-24 VITALS — BP 137/84 | HR 114 | Temp 97.9°F | Ht 60.0 in | Wt 190.0 lb

## 2021-09-24 DIAGNOSIS — I10 Essential (primary) hypertension: Secondary | ICD-10-CM

## 2021-09-24 DIAGNOSIS — E669 Obesity, unspecified: Secondary | ICD-10-CM | POA: Diagnosis not present

## 2021-09-24 DIAGNOSIS — R7309 Other abnormal glucose: Secondary | ICD-10-CM

## 2021-09-24 DIAGNOSIS — Z6837 Body mass index (BMI) 37.0-37.9, adult: Secondary | ICD-10-CM | POA: Diagnosis not present

## 2021-09-24 DIAGNOSIS — Z7985 Long-term (current) use of injectable non-insulin antidiabetic drugs: Secondary | ICD-10-CM

## 2021-09-24 MED ORDER — OZEMPIC (0.25 OR 0.5 MG/DOSE) 2 MG/1.5ML ~~LOC~~ SOPN: 0.5000 mg | PEN_INJECTOR | SUBCUTANEOUS | 0 refills | Status: DC

## 2021-09-25 NOTE — Progress Notes (Signed)
Chief Complaint:   OBESITY [name] is here to discuss her progress with her obesity treatment plan along with follow-up of her obesity related diagnoses. Wade is on keeping a food journal and adhering to recommended goals of 1100-1300 calories and 80+ grams of protein and states she is following her eating plan approximately 75% of the time. [name] states she is walking and Tai chi 60 minutes 2 times per week.  Today's visit was #: 25 Starting weight: 242 lbs Starting date: 11/02/2019 Today's weight: 190 lbs Today's date: 09/24/2021 Total lbs lost to date: 52 lbs Total lbs lost since last in-office visit: 1  Interim History: [name] had a month of cycling between following plan and then eating indulgently. When she was not being as mindful she stopped the Ozempic. Noticed increase in sugar consumption. She restarted Ozempic when she started cutting back on sugar consumption. She voices some concern about pain in her left hip last night and it's possible connection with a cyst on her nail bed.  Subjective:   1. Elevated random blood glucose level [name] is currently on Ozempic again. Denies GI side effects. Significant improvement in blood sugars on Ozempic.  2. Essential hypertension [name]'s blood pressure is well managed today. Denies chest pain, chest pressure and headache. She is currently taking Amlodipine.  Assessment/Plan:   1. Elevated random blood glucose level We will refill Ozempic 0.5 mg SubQ once weekly for 1 month with 0 refills.  -Refill Semaglutide,0.25 or 0.5MG/DOS, (OZEMPIC, 0.25 OR 0.5 MG/DOSE,) 2 MG/1.5ML SOPN; Inject 0.5 mg into the skin once a week.  Dispense: 3 mL; Refill: 0  2. Essential hypertension Radha will continue taking Amlodipine with no changes in dose.  3. Obesity with current BMI is 37.3 [name] is currently in the action stage of change. As such, her goal is to continue with weight loss efforts. She has agreed to keeping a food journal and  adhering to recommended goals of 1100-1300 calories and 80+ grams of protein daily.  Exercise goals: As is.  Behavioral modification strategies: increasing lean protein intake, meal planning and cooking strategies, keeping healthy foods in the home, planning for success, and keeping a strict food journal.  [name] has agreed to follow-up with our clinic in 5 weeks. She was informed of the importance of frequent follow-up visits to maximize her success with intensive lifestyle modifications for her multiple health conditions.   Objective:   Blood pressure 137/84, pulse (!) 114, temperature 97.9 F (36.6 C), height 5' (1.524 m), weight 190 lb (86.2 kg), SpO2 97 %. Body mass index is 37.11 kg/m.  General: Cooperative, alert, well developed, in no acute distress. HEENT: Conjunctivae and lids unremarkable. Cardiovascular: Regular rhythm.  Lungs: Normal work of breathing. Neurologic: No focal deficits.   Lab Results  Component Value Date   CREATININE 0.74 05/21/2020   BUN 17 05/21/2020   NA 140 05/21/2020   K 4.8 05/21/2020   CL 103 05/21/2020   CO2 22 05/21/2020   Lab Results  Component Value Date   ALT 13 05/21/2020   AST 17 05/21/2020   ALKPHOS 114 05/21/2020   BILITOT 1.0 05/21/2020   Lab Results  Component Value Date   HGBA1C 5.3 05/21/2020   HGBA1C 5.8 (H) 11/02/2019   Lab Results  Component Value Date   INSULIN 27.8 (H) 05/21/2020   INSULIN 33.0 (H) 11/02/2019   Lab Results  Component Value Date   TSH 2.610 11/02/2019   Lab Results  Component Value Date  CHOL 254 (H) 05/21/2020   HDL 82 05/21/2020   LDLCALC 150 (H) 05/21/2020   TRIG 128 05/21/2020   Lab Results  Component Value Date   VD25OH 78.9 11/02/2019   Lab Results  Component Value Date   WBC 9.7 11/02/2019   HGB 15.6 11/02/2019   HCT 45.4 11/02/2019   MCV 89 11/02/2019   PLT 368 11/02/2019   No results found for: "IRON", "TIBC", "FERRITIN"  Obesity Behavioral Intervention:    Approximately 15 minutes were spent on the discussion below.  ASK: We discussed the diagnosis of obesity with Zara today and Nolah agreed to give [name] permission to discuss obesity behavioral modification therapy today.  ASSESS: [name] has the diagnosis of obesity and her BMI today is 37.3. [name] is in the action stage of change.   ADVISE: [name] was educated on the multiple health risks of obesity as well as the benefit of weight loss to improve her health. She was advised of the need for long term treatment and the importance of lifestyle modifications to improve her current health and to decrease her risk of future health problems.  AGREE: Multiple dietary modification options and treatment options were discussed and [name] agreed to follow the recommendations documented in the above note.  ARRANGE: [name] was educated on the importance of frequent visits to treat obesity as outlined per CMS and USPSTF guidelines and agreed to schedule her next follow up appointment today.  Attestation Statements:   Reviewed by clinician on day of visit: allergies, medications, problem list, medical history, surgical history, family history, social history, and previous encounter notes.  I, Elnora Morrison, RMA am acting as transcriptionist for [name], MD. I have reviewed the above documentation for accuracy and completeness, and I agree with the above. - [name], MD

## 2021-10-30 ENCOUNTER — Encounter (INDEPENDENT_AMBULATORY_CARE_PROVIDER_SITE_OTHER): Payer: Self-pay | Admitting: Family Medicine

## 2021-10-30 ENCOUNTER — Ambulatory Visit (INDEPENDENT_AMBULATORY_CARE_PROVIDER_SITE_OTHER): Payer: Medicare Other | Admitting: Family Medicine

## 2021-10-30 VITALS — BP 108/72 | HR 115 | Temp 98.2°F | Ht 60.0 in | Wt 188.0 lb

## 2021-10-30 DIAGNOSIS — E669 Obesity, unspecified: Secondary | ICD-10-CM

## 2021-10-30 DIAGNOSIS — Z6836 Body mass index (BMI) 36.0-36.9, adult: Secondary | ICD-10-CM | POA: Diagnosis not present

## 2021-10-30 DIAGNOSIS — I10 Essential (primary) hypertension: Secondary | ICD-10-CM | POA: Diagnosis not present

## 2021-10-30 DIAGNOSIS — R7309 Other abnormal glucose: Secondary | ICD-10-CM | POA: Diagnosis not present

## 2021-10-30 MED ORDER — OZEMPIC (0.25 OR 0.5 MG/DOSE) 2 MG/1.5ML ~~LOC~~ SOPN: 0.5000 mg | PEN_INJECTOR | SUBCUTANEOUS | 0 refills | Status: DC

## 2021-11-04 NOTE — Progress Notes (Signed)
Chief Complaint:   OBESITY [name] is here to discuss her progress with her obesity treatment plan along with follow-up of her obesity related diagnoses. [name] is on keeping a food journal and adhering to recommended goals of 1100-1300 calories and 80+ grams of protein and states she is following her eating plan approximately 80-85% of the time. [name] states she is walking 60 minutes 2 times per week.  Today's visit was #: 26 Starting weight: 242 lbs Starting date: 11/02/2019 Today's weight: 188 lbs Today's date: 10/30/2021 Total lbs lost to date: 54 lbs Total lbs lost since last in-office visit: 2  Interim History: [name] has been not in the best mood since the end of June. Recognizes she felt a high coming off surgery and doing so well that now she is not feeling as elated. She is surprised that scale has changed in downward trajectory.  Subjective:   1. Elevated random blood glucose level [name] is doing well on Ozempic 0.5 mg. Denies GI side effects. Still able to get protein in.  2. Essential hypertension [name]'s blood pressure very well managed today. Denies chest pain, chest pressure and headache.  Assessment/Plan:   1. Elevated random blood glucose level We will refill Ozempic 0.5 mg SubQ once weekly for 1 month with 0 refills.  -Refill Semaglutide,0.25 or 0.5MG /DOS, (OZEMPIC, 0.25 OR 0.5 MG/DOSE,) 2 MG/1.5ML SOPN; Inject 0.5 mg into the skin once a week.  Dispense: 3 mL; Refill: 0  2. Essential hypertension Serenitie will follow up with blood pressure at next appointment---if continues to stay controlled can consider decreasing medication.  3. Obesity with current BMI is 36.9 [name] is currently in the action stage of change. As such, her goal is to continue with weight loss efforts. She has agreed to keeping a food journal and adhering to recommended goals of 1100-1300 calories and 80+ grams of protein daily.  Exercise goals: All adults should avoid inactivity. Some  physical activity is better than none, and adults who participate in any amount of physical activity gain some health benefits.  Behavioral modification strategies: increasing lean protein intake, meal planning and cooking strategies, keeping healthy foods in the home, and planning for success.  [name] has agreed to follow-up with our clinic in 6 weeks. She was informed of the importance of frequent follow-up visits to maximize her success with intensive lifestyle modifications for her multiple health conditions.   Objective:   Blood pressure 108/72, pulse (!) 115, temperature 98.2 F (36.8 C), height 5' (1.524 m), weight 188 lb (85.3 kg), SpO2 96 %. Body mass index is 36.72 kg/m.  General: Cooperative, alert, well developed, in no acute distress. HEENT: Conjunctivae and lids unremarkable. Cardiovascular: Regular rhythm.  Lungs: Normal work of breathing. Neurologic: No focal deficits.   Lab Results  Component Value Date   CREATININE 0.74 05/21/2020   BUN 17 05/21/2020   NA 140 05/21/2020   K 4.8 05/21/2020   CL 103 05/21/2020   CO2 22 05/21/2020   Lab Results  Component Value Date   ALT 13 05/21/2020   AST 17 05/21/2020   ALKPHOS 114 05/21/2020   BILITOT 1.0 05/21/2020   Lab Results  Component Value Date   HGBA1C 5.3 05/21/2020   HGBA1C 5.8 (H) 11/02/2019   Lab Results  Component Value Date   INSULIN 27.8 (H) 05/21/2020   INSULIN 33.0 (H) 11/02/2019   Lab Results  Component Value Date   TSH 2.610 11/02/2019   Lab Results  Component Value Date  CHOL 254 (H) 05/21/2020   HDL 82 05/21/2020   LDLCALC 150 (H) 05/21/2020   TRIG 128 05/21/2020   Lab Results  Component Value Date   VD25OH 78.9 11/02/2019   Lab Results  Component Value Date   WBC 9.7 11/02/2019   HGB 15.6 11/02/2019   HCT 45.4 11/02/2019   MCV 89 11/02/2019   PLT 368 11/02/2019   No results found for: "IRON", "TIBC", "FERRITIN"  Attestation Statements:   Reviewed by clinician on day of  visit: allergies, medications, problem list, medical history, surgical history, family history, social history, and previous encounter notes.  I, Fortino Sic, RMA am acting as transcriptionist for Reuben Likes, MD.  I have reviewed the above documentation for accuracy and completeness, and I agree with the above. - Reuben Likes, MD

## 2021-11-05 ENCOUNTER — Telehealth (INDEPENDENT_AMBULATORY_CARE_PROVIDER_SITE_OTHER): Payer: Self-pay | Admitting: Family Medicine

## 2021-11-05 NOTE — Telephone Encounter (Signed)
Dr. Lawson Radar - Prior authorization not required for Ozempic. Per insurance : This medication or product is on plan's list of covered drugs. Patient has already been getting medication without prior authorization. No further action needed.

## 2021-11-21 ENCOUNTER — Telehealth (INDEPENDENT_AMBULATORY_CARE_PROVIDER_SITE_OTHER): Payer: Self-pay | Admitting: Family Medicine

## 2021-11-21 NOTE — Telephone Encounter (Signed)
Spoke w/ pt, will take letter to pharmacy

## 2021-11-21 NOTE — Telephone Encounter (Signed)
Please check PA, Thanks

## 2021-11-21 NOTE — Telephone Encounter (Signed)
Pt stopped by because her Ozempic needs prior authorization.

## 2021-11-21 NOTE — Telephone Encounter (Signed)
Prior authorization for Ozempic is not required per insurance. See media tab for response from insurance company. Pharmacy is filing wrong insurance for patient.

## 2021-12-04 ENCOUNTER — Ambulatory Visit (INDEPENDENT_AMBULATORY_CARE_PROVIDER_SITE_OTHER): Payer: Medicare Other | Admitting: Family Medicine

## 2021-12-04 ENCOUNTER — Encounter (INDEPENDENT_AMBULATORY_CARE_PROVIDER_SITE_OTHER): Payer: Self-pay | Admitting: Family Medicine

## 2021-12-04 VITALS — BP 126/90 | HR 119 | Temp 98.3°F | Ht 60.0 in | Wt 191.0 lb

## 2021-12-04 DIAGNOSIS — R7309 Other abnormal glucose: Secondary | ICD-10-CM | POA: Diagnosis not present

## 2021-12-04 DIAGNOSIS — E7849 Other hyperlipidemia: Secondary | ICD-10-CM

## 2021-12-04 DIAGNOSIS — Z6837 Body mass index (BMI) 37.0-37.9, adult: Secondary | ICD-10-CM

## 2021-12-04 DIAGNOSIS — E669 Obesity, unspecified: Secondary | ICD-10-CM | POA: Diagnosis not present

## 2021-12-04 MED ORDER — OZEMPIC (0.25 OR 0.5 MG/DOSE) 2 MG/1.5ML ~~LOC~~ SOPN: 0.5000 mg | PEN_INJECTOR | SUBCUTANEOUS | 0 refills | Status: DC

## 2021-12-06 NOTE — Progress Notes (Signed)
Chief Complaint:   OBESITY [name] is here to discuss her progress with her obesity treatment plan along with follow-up of her obesity related diagnoses. [name] is on keeping a food journal and adhering to recommended goals of 1100-1300 calories and 80+ grams of protein and states she is following her eating plan approximately 85-90% of the time. [name] states she is Taichi/walking 60 minutes 2/2 times per week.  Today's visit was #: 27 Starting weight: 242 lbs Starting date: 11/02/2019 Today's weight: 191 lbs Today's date: 12/04/2021 Total lbs lost to date: 51 lbs Total lbs lost since last in-office visit: 0  Interim History: [name] had a hard time getting Ozempic due to erroneous sending of Rx to different insurance. She does acknowledge she has eaten slightly less controlled and structured. Recognizes she did as much protein. Has reached out to few places to do some volunteering.   Subjective:   1. Elevated random blood glucose level [name] is on Ozempic 0.5mg  SubQ weekly. Denies GI side effects. A1c 5.3, insulin 27.8.  2. Other hyperlipidemia [name] last LDL 150, Trigly 128, HDL 82. She is not on medication.  Assessment/Plan:   1. Elevated random blood glucose level We will refill Ozempic 0.5 mg SubQ weekly for 1 month with 0 refills.  -Refill Semaglutide,0.25 or 0.5MG /DOS, (OZEMPIC, 0.25 OR 0.5 MG/DOSE,) 2 MG/1.5ML SOPN; Inject 0.5 mg into the skin once a week.  Dispense: 3 mL; Refill: 0  2. Other hyperlipidemia Continue journaling with no changes in treatment.  3. Obesity with current BMI is 37.4 [name] is currently in the action stage of change. As such, her goal is to continue with weight loss efforts. She has agreed to keeping a food journal and adhering to recommended goals of 1100-1300 calories and 80+ grams of protein daily.   Exercise goals: As is.  Behavioral modification strategies: increasing lean protein intake, meal planning and cooking strategies,  keeping healthy foods in the home, planning for success, and keeping a strict food journal.  [name] has agreed to follow-up with our clinic in 6 weeks. She was informed of the importance of frequent follow-up visits to maximize her success with intensive lifestyle modifications for her multiple health conditions.   Objective:   Blood pressure (!) 126/90, pulse (!) 119, temperature 98.3 F (36.8 C), height 5' (1.524 m), weight 191 lb (86.6 kg), SpO2 96 %. Body mass index is 37.3 kg/m.  General: Cooperative, alert, well developed, in no acute distress. HEENT: Conjunctivae and lids unremarkable. Cardiovascular: Regular rhythm.  Lungs: Normal work of breathing. Neurologic: No focal deficits.   Lab Results  Component Value Date   CREATININE 0.74 05/21/2020   BUN 17 05/21/2020   NA 140 05/21/2020   K 4.8 05/21/2020   CL 103 05/21/2020   CO2 22 05/21/2020   Lab Results  Component Value Date   ALT 13 05/21/2020   AST 17 05/21/2020   ALKPHOS 114 05/21/2020   BILITOT 1.0 05/21/2020   Lab Results  Component Value Date   HGBA1C 5.3 05/21/2020   HGBA1C 5.8 (H) 11/02/2019   Lab Results  Component Value Date   INSULIN 27.8 (H) 05/21/2020   INSULIN 33.0 (H) 11/02/2019   Lab Results  Component Value Date   TSH 2.610 11/02/2019   Lab Results  Component Value Date   CHOL 254 (H) 05/21/2020   HDL 82 05/21/2020   LDLCALC 150 (H) 05/21/2020   TRIG 128 05/21/2020   Lab Results  Component Value Date  VD25OH 78.9 11/02/2019   Lab Results  Component Value Date   WBC 9.7 11/02/2019   HGB 15.6 11/02/2019   HCT 45.4 11/02/2019   MCV 89 11/02/2019   PLT 368 11/02/2019   No results found for: "IRON", "TIBC", "FERRITIN"  Obesity Behavioral Intervention:   Approximately 15 minutes were spent on the discussion below.  ASK: We discussed the diagnosis of obesity with Dametria today and [name] agreed to give [name] permission to discuss obesity behavioral modification therapy  today.  ASSESS: [name] has the diagnosis of obesity and her BMI today is 37.4. [name] is in the action stage of change.   ADVISE: [name] was educated on the multiple health risks of obesity as well as the benefit of weight loss to improve her health. She was advised of the need for long term treatment and the importance of lifestyle modifications to improve her current health and to decrease her risk of future health problems.  AGREE: Multiple dietary modification options and treatment options were discussed and [name] agreed to follow the recommendations documented in the above note.  ARRANGE: [name] was educated on the importance of frequent visits to treat obesity as outlined per CMS and USPSTF guidelines and agreed to schedule her next follow up appointment today.  Attestation Statements:   Reviewed by clinician on day of visit: allergies, medications, problem list, medical history, surgical history, family history, social history, and previous encounter notes.  I, Fortino Sic, RMA am acting as transcriptionist for Sylvia Likes, MD. I have reviewed the above documentation for accuracy and completeness, and I agree with the above. - Sylvia Likes, MD

## 2022-01-15 ENCOUNTER — Encounter (INDEPENDENT_AMBULATORY_CARE_PROVIDER_SITE_OTHER): Payer: Self-pay | Admitting: Family Medicine

## 2022-01-15 ENCOUNTER — Ambulatory Visit (INDEPENDENT_AMBULATORY_CARE_PROVIDER_SITE_OTHER): Payer: Medicare Other | Admitting: Family Medicine

## 2022-01-15 VITALS — BP 138/83 | HR 115 | Temp 98.2°F | Ht 60.0 in | Wt 193.0 lb

## 2022-01-15 DIAGNOSIS — E669 Obesity, unspecified: Secondary | ICD-10-CM | POA: Diagnosis not present

## 2022-01-15 DIAGNOSIS — Z6837 Body mass index (BMI) 37.0-37.9, adult: Secondary | ICD-10-CM

## 2022-01-15 DIAGNOSIS — I10 Essential (primary) hypertension: Secondary | ICD-10-CM

## 2022-01-15 DIAGNOSIS — R739 Hyperglycemia, unspecified: Secondary | ICD-10-CM

## 2022-01-15 DIAGNOSIS — E66813 Obesity, class 3: Secondary | ICD-10-CM

## 2022-01-15 DIAGNOSIS — R7309 Other abnormal glucose: Secondary | ICD-10-CM | POA: Diagnosis not present

## 2022-01-15 MED ORDER — OZEMPIC (0.25 OR 0.5 MG/DOSE) 2 MG/1.5ML ~~LOC~~ SOPN: 0.5000 mg | PEN_INJECTOR | SUBCUTANEOUS | 0 refills | Status: DC

## 2022-01-21 NOTE — Progress Notes (Signed)
Chief Complaint:   OBESITY [name] is here to discuss her progress with her obesity treatment plan along with follow-up of her obesity related diagnoses. [name] is on keeping a food journal and adhering to recommended goals of [phone] calories and 80+ grams protein and states she is following her eating plan approximately 50% of the time. [name] states she is doing Taichi 60 minutes 2 times per week.  Today's visit was #: 28 Starting weight: 242 lbs Starting date: 11/02/2019 Today's weight: 193 Today's date: 01/15/2022 Total lbs lost to date: 49 lbs Total lbs lost since last in-office visit: 0  Interim History: [name] has had a difficult last few months with 2 deaths and daughter being diagnosed with large fibroid and needing surgery and breaking her toe. She recognizes that she did some emotional eating over last few weeks. She realized she was doing this and did not care much.  Subjective:   1. Elevated random blood glucose level [name] is on Ozempic 0.5 mg weekly. Her last A1c of 5.3, insulin 27.8.  2. Essential hypertension [name]'s blood pressure is well controlled today. Denies chest pain, chest pressure and headache. She is on Amlodipine with good control.  Assessment/Plan:   1. Elevated random blood glucose level We will refill Ozempic 0.5 mg SubQ once weekly for 1 month with 0 refills.  -Refill Semaglutide,0.25 or 0.5MG /DOS, (OZEMPIC, 0.25 OR 0.5 MG/DOSE,) 2 MG/1.5ML SOPN; Inject 0.5 mg into the skin once a week.  Dispense: 9 mL; Refill: 0  2. Essential hypertension Follow up blood pressure at next appointment without any changes in dose.  3. Obesity with current BMI is 37.8 [name] is currently in the action stage of change. As such, her goal is to continue with weight loss efforts. She has agreed to keeping a food journal and adhering to recommended goals of 1100-1300 calories and 80+ grams of protein daily.   Exercise goals: As is.  Behavioral modification  strategies: increasing lean protein intake, meal planning and cooking strategies, keeping healthy foods in the home, and planning for success.  [name] has agreed to follow-up with our clinic in 6 weeks. She was informed of the importance of frequent follow-up visits to maximize her success with intensive lifestyle modifications for her multiple health conditions.   Objective:   Blood pressure 138/83, pulse (!) 115, temperature 98.2 F (36.8 C), height 5' (1.524 m), weight 193 lb (87.5 kg), SpO2 98 %. Body mass index is 37.69 kg/m.  General: Cooperative, alert, well developed, in no acute distress. HEENT: Conjunctivae and lids unremarkable. Cardiovascular: Regular rhythm.  Lungs: Normal work of breathing. Neurologic: No focal deficits.   Lab Results  Component Value Date   CREATININE 0.74 05/21/2020   BUN 17 05/21/2020   NA 140 05/21/2020   K 4.8 05/21/2020   CL 103 05/21/2020   CO2 22 05/21/2020   Lab Results  Component Value Date   ALT 13 05/21/2020   AST 17 05/21/2020   ALKPHOS 114 05/21/2020   BILITOT 1.0 05/21/2020   Lab Results  Component Value Date   HGBA1C 5.3 05/21/2020   HGBA1C 5.8 (H) 11/02/2019   Lab Results  Component Value Date   INSULIN 27.8 (H) 05/21/2020   INSULIN 33.0 (H) 11/02/2019   Lab Results  Component Value Date   TSH 2.610 11/02/2019   Lab Results  Component Value Date   CHOL 254 (H) 05/21/2020   HDL 82 05/21/2020   LDLCALC 150 (H) 05/21/2020   TRIG 128 05/21/2020  Lab Results  Component Value Date   VD25OH 78.9 11/02/2019   Lab Results  Component Value Date   WBC 9.7 11/02/2019   HGB 15.6 11/02/2019   HCT 45.4 11/02/2019   MCV 89 11/02/2019   PLT 368 11/02/2019   No results found for: "IRON", "TIBC", "FERRITIN"  Obesity Behavioral Intervention:   Approximately 15 minutes were spent on the discussion below.  ASK: We discussed the diagnosis of obesity with [name] today and [name] agreed to give [name] permission to discuss  obesity behavioral modification therapy today.  ASSESS: [name] has the diagnosis of obesity and her BMI today is 37.8. [name] is in the action stage of change.   ADVISE: [name] was educated on the multiple health risks of obesity as well as the benefit of weight loss to improve her health. She was advised of the need for long term treatment and the importance of lifestyle modifications to improve her current health and to decrease her risk of future health problems.  AGREE: Multiple dietary modification options and treatment options were discussed and [name] agreed to follow the recommendations documented in the above note.  ARRANGE: [name] was educated on the importance of frequent visits to treat obesity as outlined per CMS and USPSTF guidelines and agreed to schedule her next follow up appointment today.  Attestation Statements:   Reviewed by clinician on day of visit: allergies, medications, problem list, medical history, surgical history, family history, social history, and previous encounter notes.  I, [name], RMA am acting as transcriptionist for Sylvia Common, MD.  I have reviewed the above documentation for accuracy and completeness, and I agree with the above. - Sylvia Common, MD

## 2022-02-08 IMAGING — MR MR SHOULDER*R* W/O CM
4 of 5 series · 20 of 40 positions shown · non-contrast
Comparison: None.

CLINICAL DATA: Right shoulder pain and decreased range of motion
for 6 weeks

EXAM:
MRI OF THE RIGHT SHOULDER WITHOUT CONTRAST
TECHNIQUE: Multiplanar, multisequence MR imaging of the shoulder was performed.
No intravenous contrast was administered.

[Series 6: T2 fat-sat · axial · right · 3.0mm · 0.47mm/px · z∈[-38,+38]mm · 7 of 27 slices shown (1 of 3)]
[im 1/27]
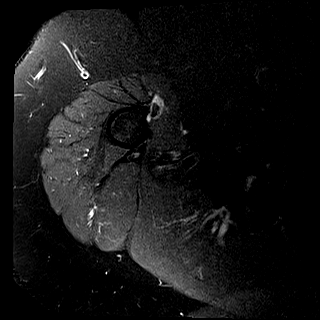
[im 3/27]
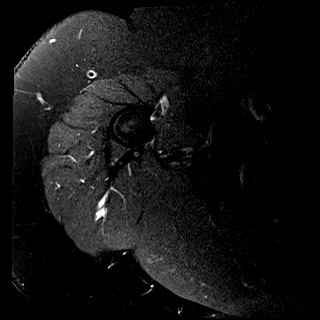
[im 9/27]
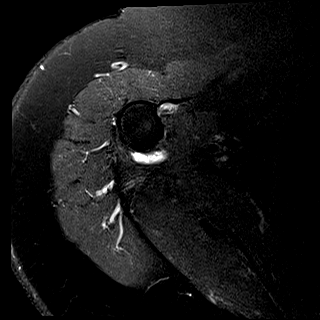
[im 12/27]
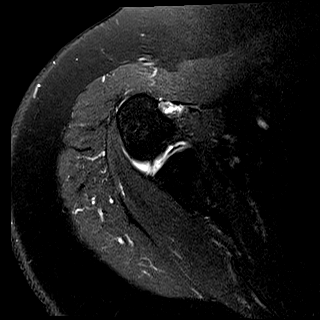
[im 15/27]
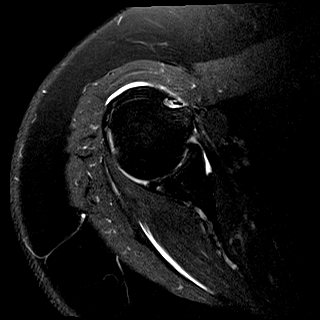
[im 18/27]
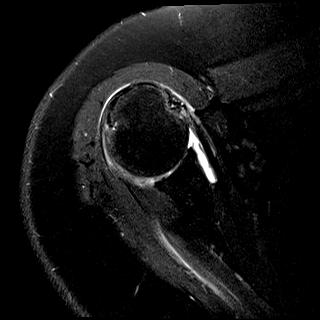
[im 24/27]
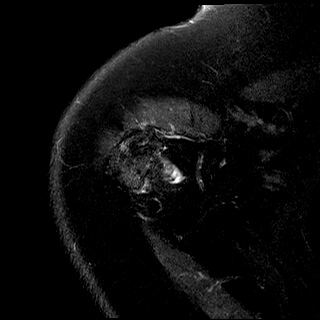

[Series 7: T2 fat-sat · oblique · right · 4.0mm · 0.22mm/px · 3 of 21 slices shown (2 of 3)]
[im 4/21]
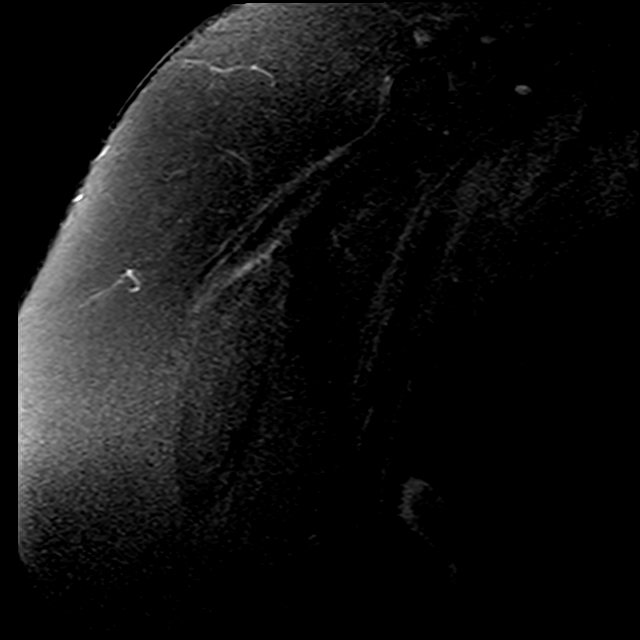
[im 11/21]
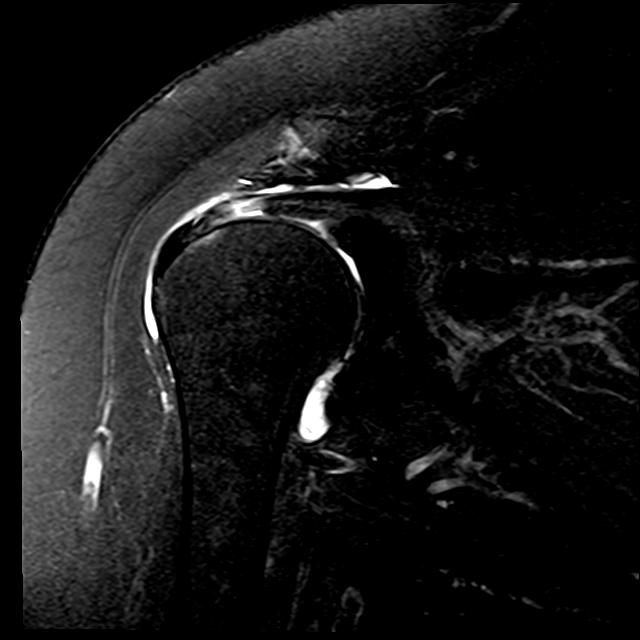
[im 17/21]
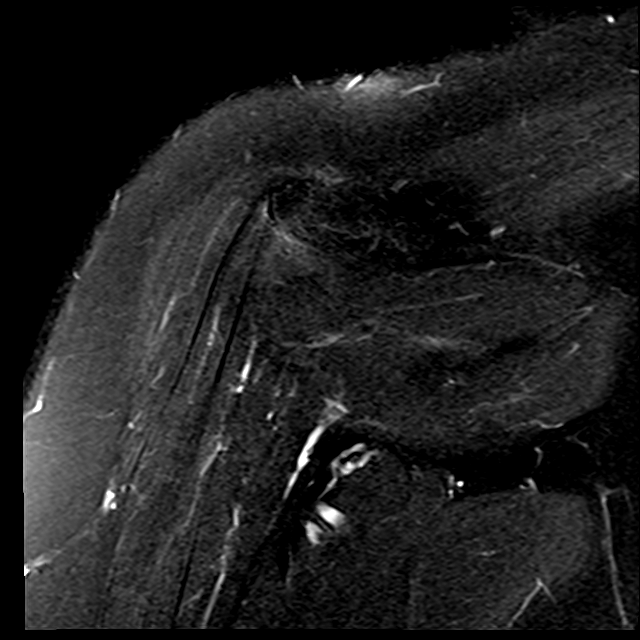

[Series 8: PD · oblique · right · 4.0mm · 0.22mm/px · 7 of 21 slices shown]
[im 1/21]
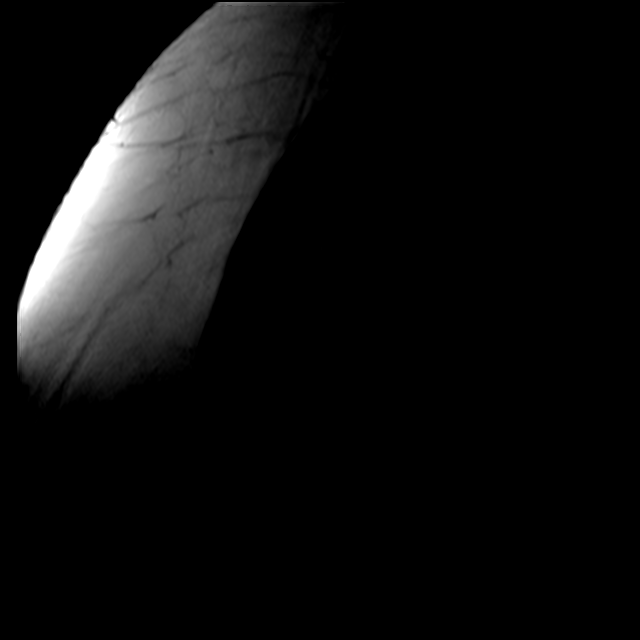
[im 4/21]
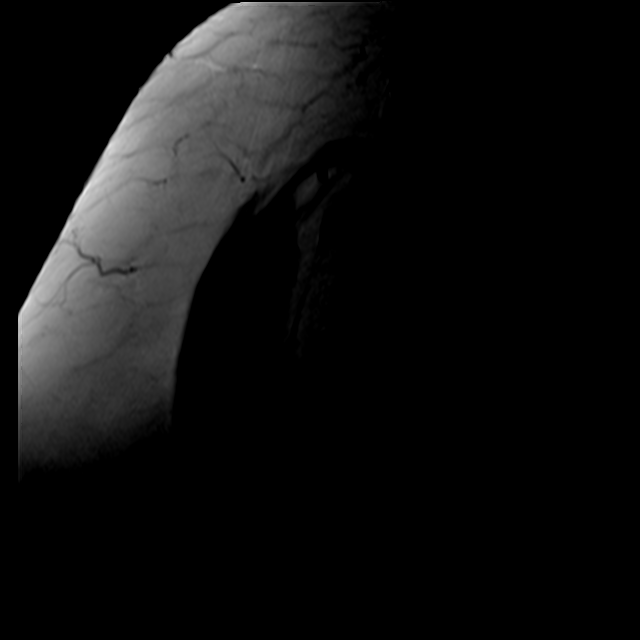
[im 7/21]
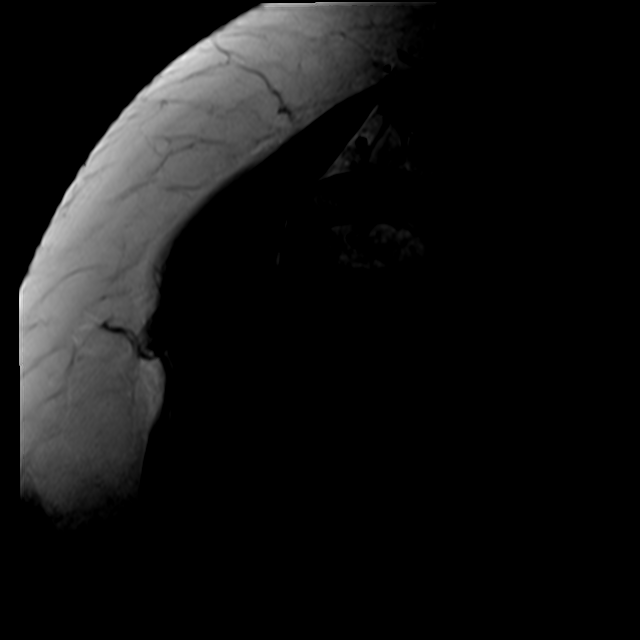
[im 11/21]
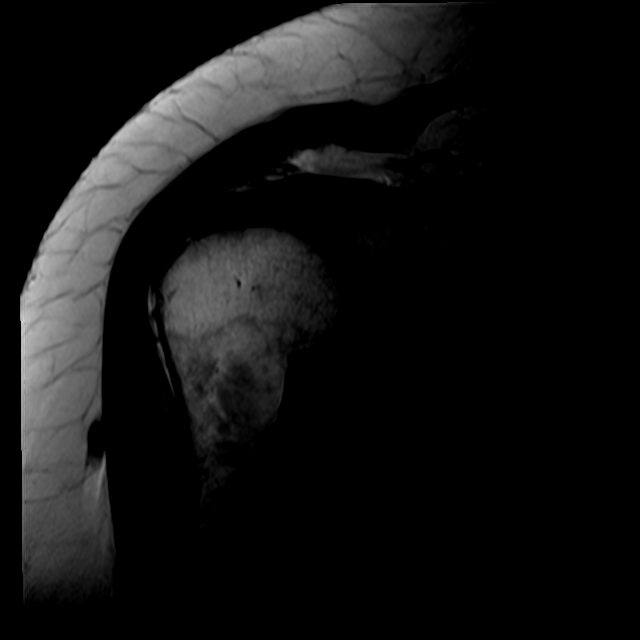
[im 14/21]
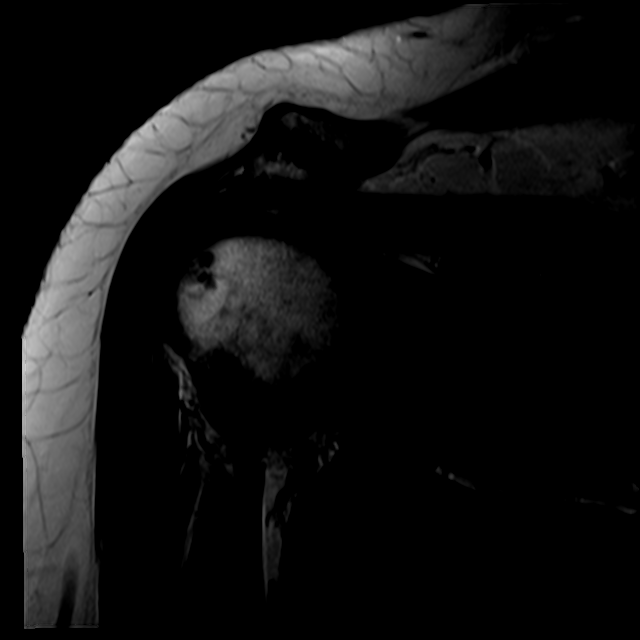
[im 17/21]
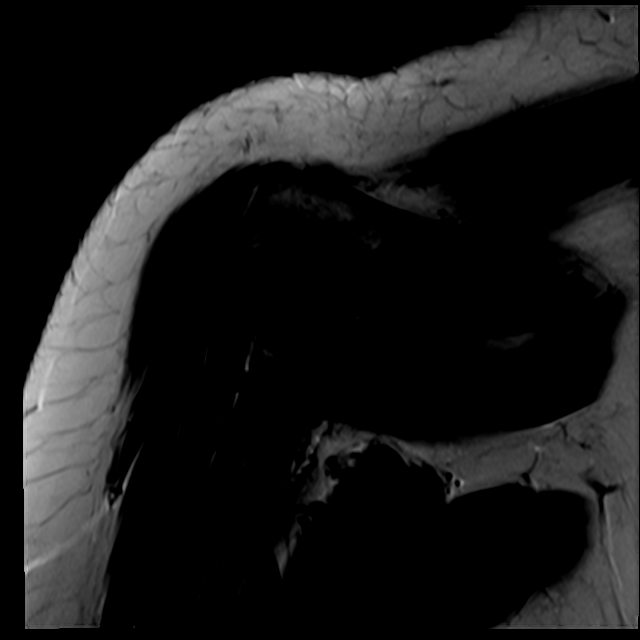
[im 21/21]
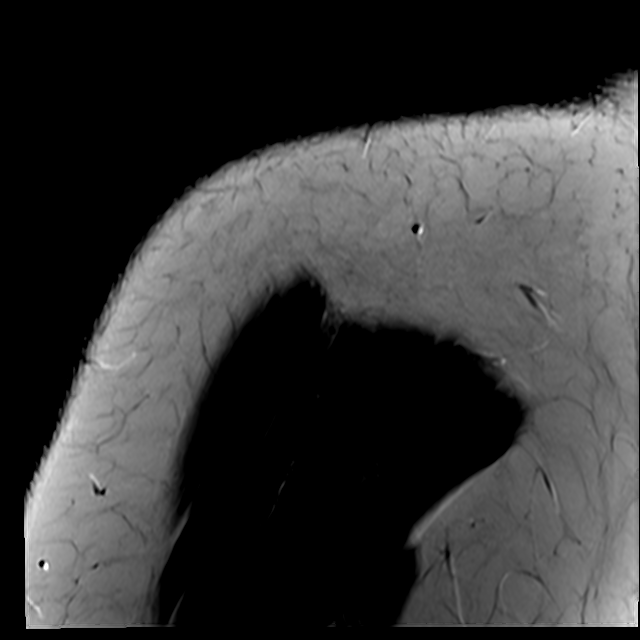

[Series 9: T2 fat-sat · coronal · right · 4.0mm · 0.44mm/px · 3 of 23 slices shown (3 of 3)]
[im 4/23]
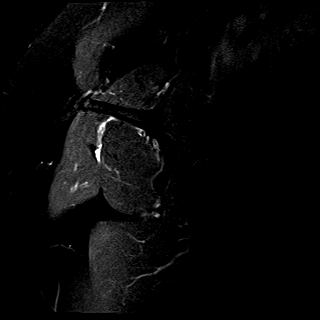
[im 13/23]
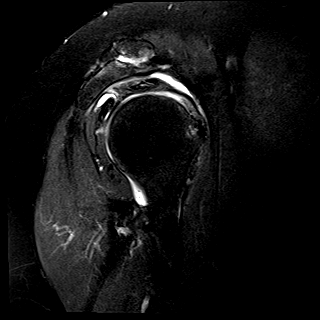
[im 19/23]
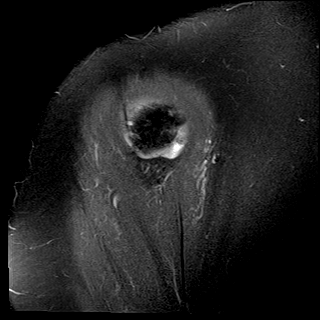

[20 of 40 positions shown; findings below may reference images not displayed]

FINDINGS: Rotator cuff: Full-thickness non retracted tear of the anterior
aspect of the distal supraspinatus tendon 1.0 cm proximal to its
insertion (series 7, images 9-10). Additional focal high-grade
articular surface tear of the mid to posterior aspect of the distal
supraspinatus tendon in the region of the critical zone (series 7,
images 11-12). Subscapularis tendinosis with partial-thickness
tearing of the distal tendon insertion. Infraspinatus tendinosis.
Intact teres minor.

Muscles: Bandlike area of edema and fluid within the infraspinatus
muscle, likely muscle strain. No rotator cuff muscle atrophy or
fatty infiltration.

Biceps long head: Intra-articular component of the long head biceps
tendon is not well seen and may be partially torn. Mild
tenosynovitis.

Acromioclavicular Joint: Moderate arthropathy of the AC joint. Small
volume subacromial-subdeltoid bursal fluid.

Glenohumeral Joint: Moderate chondral thinning and surface
irregularity with near full-thickness cartilage loss of the superior
glenoid and superomedial humeral head. Small glenohumeral joint
effusion with suggestion of synovitis.

Labrum: Small amount of fluid undercuts the posterior labrum
suspicious for tear (series 9, image 9).

Bones: No acute fracture. No dislocation. No suspicious bone lesion.

Other: None.
IMPRESSION: 1. Full-thickness non-retracted tear of the anterior aspect of the
distal supraspinatus tendon. Additional focal high-grade articular
surface perforation/tear of the mid to posterior aspect of the
supraspinatus tendon in the region of the critical zone.
2. Subscapularis tendinosis with partial-thickness tearing.
3. Intra-articular component of the long head biceps tendon is not
well seen and may be partially torn. Mild biceps tenosynovitis.
4. Moderate glenohumeral and AC joint osteoarthritis.
5. Small glenohumeral joint effusion with synovitis.

## 2022-02-26 ENCOUNTER — Ambulatory Visit (INDEPENDENT_AMBULATORY_CARE_PROVIDER_SITE_OTHER): Payer: Medicare Other | Admitting: Family Medicine

## 2022-02-26 ENCOUNTER — Encounter (INDEPENDENT_AMBULATORY_CARE_PROVIDER_SITE_OTHER): Payer: Self-pay | Admitting: Family Medicine

## 2022-02-26 VITALS — BP 142/83 | HR 116 | Temp 97.8°F | Ht 60.0 in | Wt 197.0 lb

## 2022-02-26 DIAGNOSIS — I10 Essential (primary) hypertension: Secondary | ICD-10-CM | POA: Diagnosis not present

## 2022-02-26 DIAGNOSIS — Z6838 Body mass index (BMI) 38.0-38.9, adult: Secondary | ICD-10-CM

## 2022-02-26 DIAGNOSIS — E669 Obesity, unspecified: Secondary | ICD-10-CM | POA: Diagnosis not present

## 2022-02-26 DIAGNOSIS — Z6841 Body Mass Index (BMI) 40.0 and over, adult: Secondary | ICD-10-CM

## 2022-02-26 DIAGNOSIS — R7309 Other abnormal glucose: Secondary | ICD-10-CM | POA: Diagnosis not present

## 2022-02-26 MED ORDER — OZEMPIC (0.25 OR 0.5 MG/DOSE) 2 MG/1.5ML ~~LOC~~ SOPN: 0.5000 mg | PEN_INJECTOR | SUBCUTANEOUS | 0 refills | Status: DC

## 2022-03-12 NOTE — Progress Notes (Signed)
Chief Complaint:   OBESITY Caresse is here to discuss her progress with her obesity treatment plan along with follow-up of her obesity related diagnoses. Deleah is on keeping a food journal and adhering to recommended goals of 1100-1300 calories and 80+ grams of protein and states she is following her eating plan approximately 50% of the time. Micki states she is Taichi 60 minutes 2 times per week.  Today's visit was #: 29 Starting weight: 242 lbs Starting date: 11/02/2019 Today's weight: 197 lbs Today's date: 02/26/2022 Total lbs lost to date: 45 lbs Total lbs lost since last in-office visit: 0  Interim History: Christinia has had a difficult month-friend died., half brother died and daughter got into a car accident. Recognizes she reverted back to her coping mechanism of eating. Had a really nice Thanksgiving. Realizes she has not gone to her other coping mechanism of meditation.  Subjective:   1. Elevated random blood glucose level Sharonne is on Ozempic. Improvement of A1c from 5.8 to 5.3.  2. Essential hypertension Latifa's blood pressure slightly elevated. Denies chest pain, chest pressure and headache. She is on Amlodipine 10 mg  Assessment/Plan:   1. Elevated random blood glucose level We will refill Ozempic 0.5 mg SubQ once a week for 1 month with 0 refills.  -Refill Semaglutide,0.25 or 0.5MG /DOS, (OZEMPIC, 0.25 OR 0.5 MG/DOSE,) 2 MG/1.5ML SOPN; Inject 0.5 mg into the skin once a week.  Dispense: 9 mL; Refill: 0  2. Essential hypertension Will follow up with blood pressure at next appointment without changes in a dose.  3. Obesity with current BMI is 38.6 Titiana is currently in the action stage of change. As such, her goal is to continue with weight loss efforts. She has agreed to keeping a food journal and adhering to recommended goals of 1100-1300 calories and 85+ grams of protein.   Exercise goals: As is.  Behavioral modification strategies: increasing lean protein  intake, meal planning and cooking strategies, and keeping healthy foods in the home.  Cresta has agreed to follow-up with our clinic in 5 weeks. She was informed of the importance of frequent follow-up visits to maximize her success with intensive lifestyle modifications for her multiple health conditions.   Objective:   Blood pressure (!) 142/83, pulse (!) 116, temperature 97.8 F (36.6 C), height 5' (1.524 m), weight 197 lb (89.4 kg), SpO2 98 %. Body mass index is 38.47 kg/m.  General: Cooperative, alert, well developed, in no acute distress. HEENT: Conjunctivae and lids unremarkable. Cardiovascular: Regular rhythm.  Lungs: Normal work of breathing. Neurologic: No focal deficits.   Lab Results  Component Value Date   CREATININE 0.74 05/21/2020   BUN 17 05/21/2020   NA 140 05/21/2020   K 4.8 05/21/2020   CL 103 05/21/2020   CO2 22 05/21/2020   Lab Results  Component Value Date   ALT 13 05/21/2020   AST 17 05/21/2020   ALKPHOS 114 05/21/2020   BILITOT 1.0 05/21/2020   Lab Results  Component Value Date   HGBA1C 5.3 05/21/2020   HGBA1C 5.8 (H) 11/02/2019   Lab Results  Component Value Date   INSULIN 27.8 (H) 05/21/2020   INSULIN 33.0 (H) 11/02/2019   Lab Results  Component Value Date   TSH 2.610 11/02/2019   Lab Results  Component Value Date   CHOL 254 (H) 05/21/2020   HDL 82 05/21/2020   LDLCALC 150 (H) 05/21/2020   TRIG 128 05/21/2020   Lab Results  Component Value Date  VD25OH 78.9 11/02/2019   Lab Results  Component Value Date   WBC 9.7 11/02/2019   HGB 15.6 11/02/2019   HCT 45.4 11/02/2019   MCV 89 11/02/2019   PLT 368 11/02/2019   No results found for: "IRON", "TIBC", "FERRITIN"  Obesity Behavioral Intervention:   Approximately 15 minutes were spent on the discussion below.  ASK: We discussed the diagnosis of obesity with Kahlie today and Ellyn agreed to give [name] permission to discuss obesity behavioral modification therapy  today.  ASSESS: Sharie has the diagnosis of obesity and her BMI today is 38.6. Kyiesha is in the action stage of change.   ADVISE: Casi was educated on the multiple health risks of obesity as well as the benefit of weight loss to improve her health. She was advised of the need for long term treatment and the importance of lifestyle modifications to improve her current health and to decrease her risk of future health problems.  AGREE: Multiple dietary modification options and treatment options were discussed and Ellan agreed to follow the recommendations documented in the above note.  ARRANGE: Lior was educated on the importance of frequent visits to treat obesity as outlined per CMS and USPSTF guidelines and agreed to schedule her next follow up appointment today.  Attestation Statements:   Reviewed by clinician on day of visit: allergies, medications, problem list, medical history, surgical history, family history, social history, and previous encounter notes.  I, Fortino Sic, RMA am acting as transcriptionist for Reuben Likes, MD.  I have reviewed the above documentation for accuracy and completeness, and I agree with the above. - Reuben Likes, MD

## 2022-04-02 ENCOUNTER — Ambulatory Visit (INDEPENDENT_AMBULATORY_CARE_PROVIDER_SITE_OTHER): Payer: Medicare Other | Admitting: Family Medicine

## 2022-04-17 ENCOUNTER — Telehealth (INDEPENDENT_AMBULATORY_CARE_PROVIDER_SITE_OTHER): Payer: Self-pay

## 2022-04-17 NOTE — Telephone Encounter (Signed)
Ozempic was denied, below is the response from insurance:  Request Reference Number: [id number]. OZEMPIC INJ 2MG /3ML is denied for not meeting the prior authorization requirement(s). Details of this decision are in the notice attached below or have been faxed to you.

## 2022-04-22 ENCOUNTER — Ambulatory Visit (INDEPENDENT_AMBULATORY_CARE_PROVIDER_SITE_OTHER): Payer: Medicare Other | Admitting: Family Medicine

## 2022-04-22 ENCOUNTER — Encounter (INDEPENDENT_AMBULATORY_CARE_PROVIDER_SITE_OTHER): Payer: Self-pay | Admitting: Family Medicine

## 2022-04-22 VITALS — BP 141/81 | HR 108 | Temp 98.0°F | Ht 60.0 in | Wt 200.0 lb

## 2022-04-22 DIAGNOSIS — Z6839 Body mass index (BMI) 39.0-39.9, adult: Secondary | ICD-10-CM

## 2022-04-22 DIAGNOSIS — R7309 Other abnormal glucose: Secondary | ICD-10-CM

## 2022-04-22 DIAGNOSIS — I10 Essential (primary) hypertension: Secondary | ICD-10-CM | POA: Diagnosis not present

## 2022-04-22 DIAGNOSIS — E669 Obesity, unspecified: Secondary | ICD-10-CM | POA: Diagnosis not present

## 2022-04-22 MED ORDER — OZEMPIC (0.25 OR 0.5 MG/DOSE) 2 MG/1.5ML ~~LOC~~ SOPN: 0.5000 mg | PEN_INJECTOR | SUBCUTANEOUS | 0 refills | Status: DC

## 2022-05-01 NOTE — Progress Notes (Signed)
Chief Complaint:   OBESITY [name] is here to discuss her progress with her obesity treatment plan along with follow-up of her obesity related diagnoses. [name] is on keeping a food journal and adhering to recommended goals of 1100 calories and 95+ grams protein and states she is following her eating plan approximately 60% of the time. [name] states she is Tai chi 60 minutes 2-3 times per week.  Today's visit was #: 30 Starting weight: 242 lbs Starting date: 11/02/2019 Today's weight: 200 lbs Today's date: 04/22/2022 Total lbs lost to date:42 lbs Total lbs lost since last in-office visit: 0  Interim History: [name] had a great holiday season and then got Covid on Jan 1.  She felt under the weather for a few weeks.  She is feeling better now.  Emotionally she is struggling and has really noticed more emotional eating in last few weeks.   Subjective:   1. Elevated random blood glucose level On Ozempic but Alleene has been inconsistent in taking due to emotional eating.  Denies GI side effects.  2. Essential hypertension Blood pressure slightly elevated today.  Denies chest pain, chest pressure and headache.  Assessment/Plan:   1. Elevated random blood glucose level Refill Ozempic 0.5 mg SubQ once a week for 1 month with 0 refills.  -Refill Semaglutide,0.25 or 0.5MG/DOS, (OZEMPIC, 0.25 OR 0.5 MG/DOSE,) 2 MG/1.5ML SOPN; Inject 0.5 mg into the skin once a week.  Dispense: 9 mL; Refill: 0  2. Essential hypertension Continue current medications.  3. Obesity with current BMI is 39.1 [name] is currently in the action stage of change. As such, her goal is to continue with weight loss efforts. She has agreed to keeping a food journal and adhering to recommended goals of 1100 calories and 90+ grams of protein daily.   Exercise goals: Older adults should follow the adult guidelines. When older adults cannot meet the adult guidelines, they should be as physically active as their abilities  and conditions will allow.   Behavioral modification strategies: increasing lean protein intake, meal planning and cooking strategies, keeping healthy foods in the home, planning for success, and keeping a strict food journal.  [name] has agreed to follow-up with our clinic in 6 weeks. She was informed of the importance of frequent follow-up visits to maximize her success with intensive lifestyle modifications for her multiple health conditions.   Objective:   Blood pressure (!) 141/81, pulse (!) 108, temperature 98 F (36.7 C), height 5' (1.524 m), weight 200 lb (90.7 kg), SpO2 100 %. Body mass index is 39.06 kg/m.  General: Cooperative, alert, well developed, in no acute distress. HEENT: Conjunctivae and lids unremarkable. Cardiovascular: Regular rhythm.  Lungs: Normal work of breathing. Neurologic: No focal deficits.   Lab Results  Component Value Date   CREATININE 0.74 05/21/2020   BUN 17 05/21/2020   NA 140 05/21/2020   K 4.8 05/21/2020   CL 103 05/21/2020   CO2 22 05/21/2020   Lab Results  Component Value Date   ALT 13 05/21/2020   AST 17 05/21/2020   ALKPHOS 114 05/21/2020   BILITOT 1.0 05/21/2020   Lab Results  Component Value Date   HGBA1C 5.3 05/21/2020   HGBA1C 5.8 (H) 11/02/2019   Lab Results  Component Value Date   INSULIN 27.8 (H) 05/21/2020   INSULIN 33.0 (H) 11/02/2019   Lab Results  Component Value Date   TSH 2.610 11/02/2019   Lab Results  Component Value Date   CHOL 254 (H)  05/21/2020   HDL 82 05/21/2020   LDLCALC 150 (H) 05/21/2020   TRIG 128 05/21/2020   Lab Results  Component Value Date   VD25OH 78.9 11/02/2019   Lab Results  Component Value Date   WBC 9.7 11/02/2019   HGB 15.6 11/02/2019   HCT 45.4 11/02/2019   MCV 89 11/02/2019   PLT 368 11/02/2019   No results found for: "IRON", "TIBC", "FERRITIN"  Attestation Statements:   Reviewed by clinician on day of visit: allergies, medications, problem list, medical history,  surgical history, family history, social history, and previous encounter notes.  I, Elnora Morrison, RMA am acting as transcriptionist for [name], MD. I have reviewed the above documentation for accuracy and completeness, and I agree with the above. - [name], MD

## 2022-05-21 ENCOUNTER — Ambulatory Visit (INDEPENDENT_AMBULATORY_CARE_PROVIDER_SITE_OTHER): Payer: Medicare Other | Admitting: Family Medicine

## 2022-06-11 ENCOUNTER — Ambulatory Visit (INDEPENDENT_AMBULATORY_CARE_PROVIDER_SITE_OTHER): Payer: Medicare Other | Admitting: Family Medicine

## 2022-06-11 ENCOUNTER — Encounter (INDEPENDENT_AMBULATORY_CARE_PROVIDER_SITE_OTHER): Payer: Self-pay | Admitting: Family Medicine

## 2022-06-11 VITALS — BP 122/83 | HR 110 | Temp 98.3°F | Ht 60.0 in | Wt 208.0 lb

## 2022-06-11 DIAGNOSIS — Z6841 Body Mass Index (BMI) 40.0 and over, adult: Secondary | ICD-10-CM | POA: Diagnosis not present

## 2022-06-11 DIAGNOSIS — R739 Hyperglycemia, unspecified: Secondary | ICD-10-CM | POA: Diagnosis not present

## 2022-06-11 DIAGNOSIS — E669 Obesity, unspecified: Secondary | ICD-10-CM | POA: Diagnosis not present

## 2022-06-11 DIAGNOSIS — I10 Essential (primary) hypertension: Secondary | ICD-10-CM

## 2022-06-11 NOTE — Progress Notes (Deleted)
Patient doesn't feel she did as well following meal plan.  She feels discouraged with the weight gain.  She recognizes that when she was more emotional recently and overindulged on her food choices.  She got more into a pattern of following what she was doing previously.  She feels there was more of a mental component to the medication than she had previously. She does feel like she needs more mental stimulation than she has had.  She questions what she has in her life that is fun anymore besides food.

## 2022-06-16 LAB — BASIC METABOLIC PANEL WITH GFR
BUN: 10 (ref 4–21)
CO2: 25 — AB (ref 13–22)
Chloride: 104 (ref 99–108)
Creatinine: 0.8 (ref 0.5–1.1)
Glucose: 108
Potassium: 4.5 meq/L (ref 3.5–5.1)
Sodium: 142 (ref 137–147)

## 2022-06-16 LAB — CBC AND DIFFERENTIAL
HCT: 45 (ref 36–46)
Hemoglobin: 14.3 (ref 12.0–16.0)
Neutrophils Absolute: 6.8
Platelets: 308 10*3/uL (ref 150–400)
WBC: 10.5

## 2022-06-16 LAB — HEPATIC FUNCTION PANEL
ALT: 19 U/L (ref 7–35)
AST: 24 (ref 13–35)
Alkaline Phosphatase: 117 (ref 25–125)
Bilirubin, Total: 0.7

## 2022-06-16 LAB — CBC: RBC: 5.03 (ref 3.87–5.11)

## 2022-06-16 LAB — HEMOGLOBIN A1C: Hemoglobin A1C: 5.7

## 2022-06-16 LAB — LIPID PANEL
Cholesterol: 228 — AB (ref 0–200)
HDL: 86 — AB (ref 35–70)
LDL Cholesterol: 125
Triglycerides: 101 (ref 40–160)

## 2022-06-16 LAB — COMPREHENSIVE METABOLIC PANEL WITH GFR
Albumin: 4.6 (ref 3.5–5.0)
Calcium: 9.5 (ref 8.7–10.7)
Globulin: 2.2

## 2022-06-16 LAB — TSH: TSH: 3.62 (ref 0.41–5.90)

## 2022-06-16 LAB — VITAMIN D 25 HYDROXY (VIT D DEFICIENCY, FRACTURES): Vit D, 25-Hydroxy: 75.5

## 2022-06-17 LAB — BASIC METABOLIC PANEL WITH GFR: Sodium: 142 (ref 137–147)

## 2022-06-18 NOTE — Progress Notes (Signed)
Chief Complaint:   OBESITY [name] is here to discuss her progress with her obesity treatment plan along with follow-up of her obesity related diagnoses. [name] is on keeping a food journal and adhering to recommended goals of 1000 calories and 90 protein and states she is following her eating plan approximately 95% of the time. [name] states she is doing tai chi for 60 minutes 2 times per week.  Today's visit was #: 102 Starting weight: 242 LBS Starting date: 11/02/2019 Today's weight: 208 LBS Today's date: 06/11/2022 Total lbs lost to date: 34 LBS Total lbs lost since last in-office visit: +8 LBS  Interim History:  Patient doesn't feel she did as well following meal plan.  She feels discouraged with the weight gain.  She recognizes that when she was more emotional recently and overindulged on her food choices.  She got more into a pattern of following what she was doing previously.  She feels there was more of a mental component to the medication than she had previously. She does feel like she needs more mental stimulation than she has had.  She questions what she has in her life that is fun anymore besides food.    Subjective:   1. Essential hypertension Blood pressure controlled today.  Patient is on Norvasc 10 mg.  Patient denies chest pressure, chest pain or headache.  2. Blood glucose elevated Patient is not on Ozempic any longer as Medicare will not cover any longer.  Assessment/Plan:   1. Essential hypertension Continue amlodipine no change in medication or dose.  2. Blood glucose elevated Follow-up on blood sugars, A1c at next lab draw.  3. BMI 40.0-44.9, adult ([place])  4. Obesity with starting BMI of 47.2 [name] is currently in the action stage of change. As such, her goal is to continue with weight loss efforts. She has agreed to keeping a food journal and adhering to recommended goals of 1100-1200 calories and 90+ protein.   Exercise goals: All adults should avoid  inactivity. Some physical activity is better than none, and adults who participate in any amount of physical activity gain some health benefits.  Behavioral modification strategies: increasing lean protein intake, meal planning and cooking strategies, keeping healthy foods in the home, planning for success, and keeping a strict food journal.  [name] has agreed to follow-up with our clinic in 4 weeks. She was informed of the importance of frequent follow-up visits to maximize her success with intensive lifestyle modifications for her multiple health conditions.   Objective:   Blood pressure 122/83, pulse (!) 110, temperature 98.3 F (36.8 C), height 5' (1.524 m), weight 208 lb (94.3 kg), SpO2 97 %. Body mass index is 40.62 kg/m.  General: Cooperative, alert, well developed, in no acute distress. HEENT: Conjunctivae and lids unremarkable. Cardiovascular: Regular rhythm.  Lungs: Normal work of breathing. Neurologic: No focal deficits.   Lab Results  Component Value Date   CREATININE 0.74 05/21/2020   BUN 17 05/21/2020   NA 140 05/21/2020   K 4.8 05/21/2020   CL 103 05/21/2020   CO2 22 05/21/2020   Lab Results  Component Value Date   ALT 13 05/21/2020   AST 17 05/21/2020   ALKPHOS 114 05/21/2020   BILITOT 1.0 05/21/2020   Lab Results  Component Value Date   HGBA1C 5.3 05/21/2020   HGBA1C 5.8 (H) 11/02/2019   Lab Results  Component Value Date   INSULIN 27.8 (H) 05/21/2020   INSULIN 33.0 (H) 11/02/2019   Lab  Results  Component Value Date   TSH 2.610 11/02/2019   Lab Results  Component Value Date   CHOL 254 (H) 05/21/2020   HDL 82 05/21/2020   LDLCALC 150 (H) 05/21/2020   TRIG 128 05/21/2020   Lab Results  Component Value Date   VD25OH 78.9 11/02/2019   Lab Results  Component Value Date   WBC 9.7 11/02/2019   HGB 15.6 11/02/2019   HCT 45.4 11/02/2019   MCV 89 11/02/2019   PLT 368 11/02/2019   No results found for: "IRON", "TIBC", "FERRITIN"  Attestation  Statements:   Reviewed by clinician on day of visit: allergies, medications, problem list, medical history, surgical history, family history, social history, and previous encounter notes.  Time spent on visit including pre-visit chart review and post-visit care and charting was 25 minutes.   I, Davy Pique, RMA, am acting as transcriptionist for [name], MD.  I have reviewed the above documentation for accuracy and completeness, and I agree with the above. - [name], MD

## 2022-07-09 ENCOUNTER — Ambulatory Visit (INDEPENDENT_AMBULATORY_CARE_PROVIDER_SITE_OTHER): Payer: Medicare Other | Admitting: Family Medicine

## 2022-07-09 ENCOUNTER — Encounter (INDEPENDENT_AMBULATORY_CARE_PROVIDER_SITE_OTHER): Payer: Self-pay | Admitting: Family Medicine

## 2022-07-09 VITALS — BP 105/70 | HR 110 | Temp 98.4°F | Ht 60.0 in | Wt 209.0 lb

## 2022-07-09 DIAGNOSIS — Z6841 Body Mass Index (BMI) 40.0 and over, adult: Secondary | ICD-10-CM | POA: Diagnosis not present

## 2022-07-09 DIAGNOSIS — I10 Essential (primary) hypertension: Secondary | ICD-10-CM

## 2022-07-09 DIAGNOSIS — R454 Irritability and anger: Secondary | ICD-10-CM | POA: Diagnosis not present

## 2022-07-09 DIAGNOSIS — E669 Obesity, unspecified: Secondary | ICD-10-CM | POA: Diagnosis not present

## 2022-07-09 MED ORDER — WEGOVY 0.5 MG/0.5ML ~~LOC~~ SOAJ: 0.5000 mg | SUBCUTANEOUS | 0 refills | Status: DC

## 2022-07-09 NOTE — Progress Notes (Signed)
Chief Complaint:   OBESITY [name] is here to discuss her progress with her obesity treatment plan along with follow-up of her obesity related diagnoses. [name] is on keeping a food journal and adhering to recommended goals of [phone] calories and 90+ protein and states she is following her eating plan approximately 66% of the time. [name] states she is Tai Chi. 60 minutes 2.  Times per week.  Today's visit was #: 32 Starting weight: 242 lbs Starting date: 11/02/2019 Today's weight: 209 lbs Today's date: 07/09/2022 Total lbs lost to date: 33 lbs Total lbs lost since last in-office visit: +1 lb  Interim History: Patient is feeling very frustrated with insurance coverage no longer covering GLP-1.  She felt significantly more cravings and drive to eat since medication has been no longer coveraged. She feels very depressed.   Subjective:   1. Essential hypertension Patient's blood pressure labile but more consistently elevated recently.  Patient is taking Norvasc.  2. Irritable mood More frustrated recently.  Patient denies suicidal or homicidal ideation.  Patient does not have a history of depression.  Assessment/Plan:   1. Essential hypertension Start- Semaglutide-Weight Management (WEGOVY) 0.5 MG/0.5ML SOAJ; Inject 0.5 mg into the skin once a week.  Dispense: 2 mL; Refill: 0  2. Irritable mood Follow-up with patient next appointment on establishment of care with therapist.  May need to revisit medication options at next appointment.  3. BMI 40.0-44.9, adult  4. Obesity with starting BMI of 47.2 [name] is currently in the action stage of change. As such, her goal is to continue with weight loss efforts. She has agreed to keeping a food journal and adhering to recommended goals of [phone] calories and 85+ protein daily.   Exercise goals: All adults should avoid inactivity. Some physical activity is better than none, and adults who participate in any amount of physical  activity gain some health benefits.  Behavioral modification strategies: increasing lean protein intake, meal planning and cooking strategies, keeping healthy foods in the home, and planning for success.  [name] has agreed to follow-up with our clinic in 3-4 weeks. She was informed of the importance of frequent follow-up visits to maximize her success with intensive lifestyle modifications for her multiple health conditions.   Objective:   Blood pressure 105/70, pulse (!) 110, temperature 98.4 F (36.9 C), height 5' (1.524 m), weight 209 lb (94.8 kg), SpO2 97 %. Body mass index is 40.82 kg/m.  General: Cooperative, alert, well developed, in no acute distress. HEENT: Conjunctivae and lids unremarkable. Cardiovascular: Regular rhythm.  Lungs: Normal work of breathing. Neurologic: No focal deficits.   Lab Results  Component Value Date   CREATININE 0.74 05/21/2020   BUN 17 05/21/2020   NA 140 05/21/2020   K 4.8 05/21/2020   CL 103 05/21/2020   CO2 22 05/21/2020   Lab Results  Component Value Date   ALT 13 05/21/2020   AST 17 05/21/2020   ALKPHOS 114 05/21/2020   BILITOT 1.0 05/21/2020   Lab Results  Component Value Date   HGBA1C 5.3 05/21/2020   HGBA1C 5.8 (H) 11/02/2019   Lab Results  Component Value Date   INSULIN 27.8 (H) 05/21/2020   INSULIN 33.0 (H) 11/02/2019   Lab Results  Component Value Date   TSH 2.610 11/02/2019   Lab Results  Component Value Date   CHOL 254 (H) 05/21/2020   HDL 82 05/21/2020   LDLCALC 150 (H) 05/21/2020   TRIG 128 05/21/2020   Lab Results  Component Value Date   VD25OH 78.9 11/02/2019   Lab Results  Component Value Date   WBC 9.7 11/02/2019   HGB 15.6 11/02/2019   HCT 45.4 11/02/2019   MCV 89 11/02/2019   PLT 368 11/02/2019   No results found for: "IRON", "TIBC", "FERRITIN"  Attestation Statements:   Reviewed by clinician on day of visit: allergies, medications, problem list, medical history, surgical history, family  history, social history, and previous encounter notes.  I, Malcolm Metro, RMA, am acting as transcriptionist for Reuben Likes, MD. I have reviewed the above documentation for accuracy and completeness, and I agree with the above. - Reuben Likes, MD

## 2022-08-06 ENCOUNTER — Encounter (INDEPENDENT_AMBULATORY_CARE_PROVIDER_SITE_OTHER): Payer: Self-pay | Admitting: Family Medicine

## 2022-08-06 ENCOUNTER — Ambulatory Visit (INDEPENDENT_AMBULATORY_CARE_PROVIDER_SITE_OTHER): Payer: Medicare Other | Admitting: Family Medicine

## 2022-08-06 VITALS — BP 123/85 | HR 112 | Temp 98.6°F | Ht 60.0 in | Wt 215.0 lb

## 2022-08-06 DIAGNOSIS — E669 Obesity, unspecified: Secondary | ICD-10-CM

## 2022-08-06 DIAGNOSIS — Z9189 Other specified personal risk factors, not elsewhere classified: Secondary | ICD-10-CM | POA: Diagnosis not present

## 2022-08-06 DIAGNOSIS — E7849 Other hyperlipidemia: Secondary | ICD-10-CM | POA: Diagnosis not present

## 2022-08-06 DIAGNOSIS — Z6841 Body Mass Index (BMI) 40.0 and over, adult: Secondary | ICD-10-CM | POA: Diagnosis not present

## 2022-08-06 MED ORDER — WEGOVY 0.25 MG/0.5ML ~~LOC~~ SOAJ: 0.2500 mg | SUBCUTANEOUS | 0 refills | Status: DC

## 2022-08-06 NOTE — Progress Notes (Signed)
Chief Complaint:   OBESITY [name] is here to discuss her progress with her obesity treatment plan along with follow-up of her obesity related diagnoses. [name] is on keeping a food journal and adhering to recommended goals of 1100 to 1200 calories and 85+ grams of protein and states she is following her eating plan approximately 60% of the time. [name] states she is doing tai chi for 60 minutes 2 times per week and walking for 2 times per week.  Today's visit was #: 33  Starting weight: 242 lbs Starting date: 11/02/2019 Today's weight: 215 lbs Today's date: 08/06/2022 Total lbs lost to date: 27 Total lbs lost since last in-office visit: 0  Interim History: Patient feels she does well getting more nutritious and protein centered food during the day but evenings tend to be difficult.  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  She thinks some her snacking is related to boredom.  She has restarted making cards as well.  She is feeling a bit frustrated with the weight gain since insurance stopped covering Ozempic.    Subjective:   1. Other hyperlipidemia [name]'s LDL is 150, HDL is 82, and her triglycerides are 128, as she is not on medicine.  2. Cardiovascular risk factor [name] has had a history of hypertension for years. Her blood pressure is labile and systolically elevated at times up to the 160's.  3. BMI 40.0-44.9, adult ([facility])  Assessment/Plan:   1. Other hyperlipidemia We will draw labs in 2 to 3 months. We discussed the importance of keeping saturated fat to less than 20% intake.  2. Cardiovascular risk factor [name] agrees to start [facility] 0.25mg  and will follow up as directed.  - Semaglutide-Weight Management (WEGOVY) 0.25 MG/0.5ML SOAJ; Inject 0.25 mg into the skin once a week.  Dispense: 2 mL; Refill: 0  3. BMI 40.0-44.9, adult ([facility])  4. Obesity with starting BMI of 47.2 [name] is currently in the action stage of change. As such, her goal is to continue  with weight loss efforts. She has agreed to keeping a food journal and adhering to recommended goals of 1100 to 1200 calories and 85+ grams of protein.   Exercise goals:  Sofa exercises given. Pt was encouraged to start 10 minutes 3 to 4 times per week.  Behavioral modification strategies: increasing lean protein intake, meal planning and cooking strategies, keeping healthy foods in the home, and planning for success.  [name] has agreed to follow-up with our clinic in 4 weeks. She was informed of the importance of frequent follow-up visits to maximize her success with intensive lifestyle modifications for her multiple health conditions.   Objective:   Blood pressure 123/85, pulse (!) 112, temperature 98.6 F (37 C), height 5' (1.524 m), weight 215 lb (97.5 kg), SpO2 95 %. Body mass index is 41.99 kg/m.  General: Cooperative, alert, well developed, in no acute distress. HEENT: Conjunctivae and lids unremarkable. Cardiovascular: Regular rhythm.  Lungs: Normal work of breathing. Neurologic: No focal deficits.   Lab Results  Component Value Date   CREATININE 0.74 05/21/2020   BUN 17 05/21/2020   NA 140 05/21/2020   K 4.8 05/21/2020   CL 103 05/21/2020   CO2 22 05/21/2020   Lab Results  Component Value Date   ALT 13 05/21/2020   AST 17 05/21/2020   ALKPHOS 114 05/21/2020   BILITOT 1.0 05/21/2020   Lab Results  Component Value Date   HGBA1C 5.3 05/21/2020  HGBA1C 5.8 (H) 11/02/2019   Lab Results  Component Value Date   INSULIN 27.8 (H) 05/21/2020   INSULIN 33.0 (H) 11/02/2019   Lab Results  Component Value Date   TSH 2.610 11/02/2019   Lab Results  Component Value Date   CHOL 254 (H) 05/21/2020   HDL 82 05/21/2020   LDLCALC 150 (H) 05/21/2020   TRIG 128 05/21/2020   Lab Results  Component Value Date   VD25OH 78.9 11/02/2019   Lab Results  Component Value Date   WBC 9.7 11/02/2019   HGB 15.6 11/02/2019   HCT 45.4 11/02/2019   MCV 89 11/02/2019   PLT 368  11/02/2019   No results found for: "IRON", "TIBC", "FERRITIN"  Obesity Behavioral Intervention:   Approximately 15 minutes were spent on the discussion below.  ASK: We discussed the diagnosis of obesity with [name] today and [name] agreed to give [name] permission to discuss obesity behavioral modification therapy today.  ASSESS: [name] has the diagnosis of obesity and her BMI today is 42.1. [name] is in the action stage of change.   ADVISE: [name] was educated on the multiple health risks of obesity as well as the benefit of weight loss to improve her health. She was advised of the need for long term treatment and the importance of lifestyle modifications to improve her current health and to decrease her risk of future health problems.  AGREE: Multiple dietary modification options and treatment options were discussed and [name] agreed to follow the recommendations documented in the above note.  ARRANGE: [name] was educated on the importance of frequent visits to treat obesity as outlined per CMS and USPSTF guidelines and agreed to schedule her next follow up appointment today.  Attestation Statements:   Reviewed by clinician on day of visit: allergies, medications, problem list, medical history, surgical history, family history, social history, and previous encounter notes.  IKirke Corin, CMA, am acting as transcriptionist for Reuben Likes, MD  I have reviewed the above documentation for accuracy and completeness, and I agree with the above. - Reuben Likes, MD

## 2022-08-14 ENCOUNTER — Telehealth (INDEPENDENT_AMBULATORY_CARE_PROVIDER_SITE_OTHER): Payer: Self-pay

## 2022-08-14 NOTE — Telephone Encounter (Signed)
WEGOVY INJ 0.25MG  is denied because it is not on your plan's Drug List (formulary). Medication authorization requires the following:  (1) Your provider submits medical records (for example: chart notes) documenting established cardiovascular disease as evidenced by one of the following: Prior myocardial infarction, prior stroke (that is, ischemic or hemorrhagic stroke), peripheral arterial disease (that is, intermittent claudication with ankle-brachial index less than 0.85, peripheral arterial revascularization procedure, or amputation due to atherosclerotic disease).

## 2022-09-02 ENCOUNTER — Encounter (INDEPENDENT_AMBULATORY_CARE_PROVIDER_SITE_OTHER): Payer: Self-pay | Admitting: Family Medicine

## 2022-09-02 ENCOUNTER — Ambulatory Visit (INDEPENDENT_AMBULATORY_CARE_PROVIDER_SITE_OTHER): Payer: Medicare Other | Admitting: Family Medicine

## 2022-09-02 VITALS — BP 133/84 | HR 103 | Temp 98.4°F | Ht 60.0 in | Wt 217.0 lb

## 2022-09-02 DIAGNOSIS — F5089 Other specified eating disorder: Secondary | ICD-10-CM

## 2022-09-02 DIAGNOSIS — E669 Obesity, unspecified: Secondary | ICD-10-CM

## 2022-09-02 DIAGNOSIS — I10 Essential (primary) hypertension: Secondary | ICD-10-CM | POA: Diagnosis not present

## 2022-09-02 DIAGNOSIS — Z6841 Body Mass Index (BMI) 40.0 and over, adult: Secondary | ICD-10-CM

## 2022-09-02 NOTE — Progress Notes (Signed)
Chief Complaint:   OBESITY [name] is here to discuss her progress with her obesity treatment plan along with follow-up of her obesity related diagnoses. [name] is on keeping a food journal and adhering to recommended goals of 1100-1200 calories and 85+ grams of protein and states she is following her eating plan approximately 60% of the time. [name] states she is doing tai chi and walking for 1 hour 2 times per week.    Today's visit was #: 34 Starting weight: 242 lbs Starting date: 11/02/2019 Today's weight: 217 lbs Today's date: 09/02/2022 Total lbs lost to date: 25 Total lbs lost since last in-office visit: 0  Interim History: Patient was not approved for [facility] and was told she could appeal.  She mentions she spent most of the month coming to acceptance that she was not approved for the medication.  She found a good cope in legos and did a zen garden and a Software engineer.  She mentions that was really good for her mental health. Has been doing mostly the same for her two meals of the day and then does popcorn so she can pick at it. She has been doing more tv series viewing. She has started using butter again and realized this may have added an additional few hundred calories to her normal intake.   Subjective:   1. Essential hypertension Patient's blood pressure is controlled today.  She denies chest pain, chest pressure, or headache.  She is on Norvasc 10 mg.  2. Other disorder of eating We discussed the risk and benefits of Qsymia.  Patient has a history of kidney stones 1 time but not recurrent.  Assessment/Plan:   1. Essential hypertension Patient will continue Norvasc with no change in dose or medication.  2. Other disorder of eating We will follow-up on the patient's interest in Qsymia at her next appointment (on makeshift Qsymia).   3. BMI 40.0-44.9, adult (HCC)  4. Obesity with starting BMI of 47.2 [name] is currently in the action stage of change. As such, her goal is  to continue with weight loss efforts. She has agreed to keeping a food journal and adhering to recommended goals of 1100-1200 calories and 85+ grams of protein daily.   Exercise goals: All adults should avoid inactivity. Some physical activity is better than none, and adults who participate in any amount of physical activity gain some health benefits.  Behavioral modification strategies: increasing lean protein intake, meal planning and cooking strategies, keeping healthy foods in the home, and planning for success.  [name] has agreed to follow-up with our clinic in 5 weeks. She was informed of the importance of frequent follow-up visits to maximize her success with intensive lifestyle modifications for her multiple health conditions.   Objective:   Blood pressure 133/84, pulse (!) 103, temperature 98.4 F (36.9 C), height 5' (1.524 m), weight 217 lb (98.4 kg), SpO2 97 %. Body mass index is 42.38 kg/m.  General: Cooperative, alert, well developed, in no acute distress. HEENT: Conjunctivae and lids unremarkable. Cardiovascular: Regular rhythm.  Lungs: Normal work of breathing. Neurologic: No focal deficits.   Lab Results  Component Value Date   CREATININE 0.74 05/21/2020   BUN 17 05/21/2020   NA 140 05/21/2020   K 4.8 05/21/2020   CL 103 05/21/2020   CO2 22 05/21/2020   Lab Results  Component Value Date   ALT 13 05/21/2020   AST 17 05/21/2020   ALKPHOS 114 05/21/2020   BILITOT 1.0 05/21/2020  Lab Results  Component Value Date   HGBA1C 5.3 05/21/2020   HGBA1C 5.8 (H) 11/02/2019   Lab Results  Component Value Date   INSULIN 27.8 (H) 05/21/2020   INSULIN 33.0 (H) 11/02/2019   Lab Results  Component Value Date   TSH 2.610 11/02/2019   Lab Results  Component Value Date   CHOL 254 (H) 05/21/2020   HDL 82 05/21/2020   LDLCALC 150 (H) 05/21/2020   TRIG 128 05/21/2020   Lab Results  Component Value Date   VD25OH 78.9 11/02/2019   Lab Results  Component Value Date    WBC 9.7 11/02/2019   HGB 15.6 11/02/2019   HCT 45.4 11/02/2019   MCV 89 11/02/2019   PLT 368 11/02/2019   No results found for: "IRON", "TIBC", "FERRITIN"  Attestation Statements:   Reviewed by clinician on day of visit: allergies, medications, problem list, medical history, surgical history, family history, social history, and previous encounter notes.  Time spent on visit including pre-visit chart review and post-visit care and charting was 24 minutes.   I, Burt Knack, am acting as transcriptionist for Reuben Likes, MD.  I have reviewed the above documentation for accuracy and completeness, and I agree with the above. - Reuben Likes, MD

## 2022-10-08 ENCOUNTER — Ambulatory Visit (INDEPENDENT_AMBULATORY_CARE_PROVIDER_SITE_OTHER): Payer: Medicare Other | Admitting: Family Medicine

## 2022-10-13 ENCOUNTER — Ambulatory Visit (INDEPENDENT_AMBULATORY_CARE_PROVIDER_SITE_OTHER): Payer: Medicare Other | Admitting: Family Medicine

## 2022-10-13 ENCOUNTER — Encounter (INDEPENDENT_AMBULATORY_CARE_PROVIDER_SITE_OTHER): Payer: Self-pay | Admitting: Family Medicine

## 2022-10-13 VITALS — BP 118/80 | HR 111 | Temp 98.6°F | Ht 60.0 in | Wt 220.0 lb

## 2022-10-13 DIAGNOSIS — Z6841 Body Mass Index (BMI) 40.0 and over, adult: Secondary | ICD-10-CM

## 2022-10-13 DIAGNOSIS — E669 Obesity, unspecified: Secondary | ICD-10-CM | POA: Diagnosis not present

## 2022-10-13 DIAGNOSIS — F5089 Other specified eating disorder: Secondary | ICD-10-CM | POA: Diagnosis not present

## 2022-10-13 DIAGNOSIS — I10 Essential (primary) hypertension: Secondary | ICD-10-CM | POA: Diagnosis not present

## 2022-10-13 MED ORDER — LOMAIRA 8 MG PO TABS: ORAL_TABLET | ORAL | 0 refills | Status: DC

## 2022-10-13 MED ORDER — TOPIRAMATE 25 MG PO TABS: ORAL_TABLET | ORAL | 0 refills | Status: DC

## 2022-10-13 NOTE — Progress Notes (Signed)
Chief Complaint:   OBESITY [name] is here to discuss her progress with her obesity treatment plan along with follow-up of her obesity related diagnoses. [name] is on keeping a food journal and adhering to recommended goals of 1100-1200 calories and 85+ grams of protein and states she is following her eating plan approximately 40% of the time. [name] states she is doing Tai Chi and walking for 60 minutes 2 times per week.  Today's visit was #: 35 Starting weight: 242 lbs Starting date: 11/02/2019 Today's weight: 220 lbs Today's date: 10/13/2022 Total lbs lost to date: 22 Total lbs lost since last in-office visit: 0  Interim History: Patient has had numerous celebrations since last appointment- birthday picnics, events.  She is starting to really feel the weight gain and she is feeling slowed down.  She doesn't want to end up back to her initial starting weight.  Interested in starting makeshift Qsymia.   Subjective:   1. Other disorder of eating Patient was on Wegovy previously with great control of nighttime snacking.  PDMP was checked with no concerns noted.  Controlled substance contract was signed by the patient.  Goal is to be down 5% or 11 pounds by early November.  2. Essential hypertension Patient's blood pressure is controlled today.  She denies chest pain, chest pressure, or headache.  She is on amlodipine 10 mg once daily.  Assessment/Plan:   1. Other disorder of eating Patient agreed to start Lomaira 4 mg for 2 weeks then increase to 8 mg; and start topiramate 25 mg for 2 weeks then increase to 50 mg with no refills.  - Phentermine HCl (LOMAIRA) 8 MG TABS; Take 0.5 tablets (4 mg total) by mouth daily for 14 days, THEN 1 tablet (8 mg total) daily for 14 days.  Dispense: 23 tablet; Refill: 0 - topiramate (TOPAMAX) 25 MG tablet; Take 1 tablet (25 mg total) by mouth daily for 14 days, THEN 2 tablets (50 mg total) daily for 14 days.  Dispense: 45 tablet; Refill: 0  2.  Essential hypertension Patient will continue amlodipine with no change in dose.  We will follow-up on her blood pressure at her subsequent appointment.  3. BMI 40.0-44.9, adult (HCC)  4. Obesity with starting BMI of 47.2 [name] is currently in the action stage of change. As such, her goal is to continue with weight loss efforts. She has agreed to keeping a food journal and adhering to recommended goals of 1100-1200 calories and 90+ grams of protein daily.   Exercise goals: All adults should avoid inactivity. Some physical activity is better than none, and adults who participate in any amount of physical activity gain some health benefits.  Behavioral modification strategies: increasing lean protein intake, meal planning and cooking strategies, keeping healthy foods in the home, and planning for success.  [name] has agreed to follow-up with our clinic in 3 to 4 weeks. She was informed of the importance of frequent follow-up visits to maximize her success with intensive lifestyle modifications for her multiple health conditions.   Objective:   Blood pressure 118/80, pulse (!) 111, temperature 98.6 F (37 C), height 5' (1.524 m), weight 220 lb (99.8 kg), SpO2 95%. Body mass index is 42.97 kg/m.  General: Cooperative, alert, well developed, in no acute distress. HEENT: Conjunctivae and lids unremarkable. Cardiovascular: Regular rhythm.  Lungs: Normal work of breathing. Neurologic: No focal deficits.   Lab Results  Component Value Date   CREATININE 0.74 05/21/2020   BUN 17 05/21/2020  NA 140 05/21/2020   K 4.8 05/21/2020   CL 103 05/21/2020   CO2 22 05/21/2020   Lab Results  Component Value Date   ALT 13 05/21/2020   AST 17 05/21/2020   ALKPHOS 114 05/21/2020   BILITOT 1.0 05/21/2020   Lab Results  Component Value Date   HGBA1C 5.3 05/21/2020   HGBA1C 5.8 (H) 11/02/2019   Lab Results  Component Value Date   INSULIN 27.8 (H) 05/21/2020   INSULIN 33.0 (H) 11/02/2019    Lab Results  Component Value Date   TSH 2.610 11/02/2019   Lab Results  Component Value Date   CHOL 254 (H) 05/21/2020   HDL 82 05/21/2020   LDLCALC 150 (H) 05/21/2020   TRIG 128 05/21/2020   Lab Results  Component Value Date   VD25OH 78.9 11/02/2019   Lab Results  Component Value Date   WBC 9.7 11/02/2019   HGB 15.6 11/02/2019   HCT 45.4 11/02/2019   MCV 89 11/02/2019   PLT 368 11/02/2019   No results found for: "IRON", "TIBC", "FERRITIN"  Attestation Statements:   Reviewed by clinician on day of visit: allergies, medications, problem list, medical history, surgical history, family history, social history, and previous encounter notes.   I, Burt Knack, am acting as transcriptionist for Reuben Likes, MD.  I have reviewed the above documentation for accuracy and completeness, and I agree with the above. - Reuben Likes, MD

## 2022-10-14 ENCOUNTER — Telehealth (INDEPENDENT_AMBULATORY_CARE_PROVIDER_SITE_OTHER): Payer: Self-pay

## 2022-10-14 NOTE — Telephone Encounter (Signed)
Prior authorization for [name] was denied by insurance as below message from the plan:  Drugs, when used for anorexia, weight loss or weight gain, are excluded from coverage under Medicare rules. Please refer to your Evidence of Coverage (EOC) document that details your Medicare prescription drug coverage.

## 2022-11-11 ENCOUNTER — Ambulatory Visit (INDEPENDENT_AMBULATORY_CARE_PROVIDER_SITE_OTHER): Payer: Medicare Other | Admitting: Family Medicine

## 2022-11-11 ENCOUNTER — Encounter (INDEPENDENT_AMBULATORY_CARE_PROVIDER_SITE_OTHER): Payer: Self-pay | Admitting: Family Medicine

## 2022-11-11 VITALS — BP 128/79 | HR 111 | Temp 97.8°F | Ht 60.0 in | Wt 219.0 lb

## 2022-11-11 DIAGNOSIS — E669 Obesity, unspecified: Secondary | ICD-10-CM

## 2022-11-11 DIAGNOSIS — R7303 Prediabetes: Secondary | ICD-10-CM | POA: Diagnosis not present

## 2022-11-11 DIAGNOSIS — F5089 Other specified eating disorder: Secondary | ICD-10-CM | POA: Diagnosis not present

## 2022-11-11 DIAGNOSIS — Z6841 Body Mass Index (BMI) 40.0 and over, adult: Secondary | ICD-10-CM | POA: Diagnosis not present

## 2022-11-11 NOTE — Progress Notes (Signed)
Chief Complaint:   OBESITY [name] is here to discuss her progress with her obesity treatment plan along with follow-up of her obesity related diagnoses. [name] is on keeping a food journal and adhering to recommended goals of 1100-1200 calories and 90+ grams of protein and states she is following her eating plan approximately 70% of the time. [name] states she is doing Tai chi for 60 minutes 2 times per week.  Today's visit was #: 36 Starting weight: 242 lbs Starting date: 11/02/2019 Today's weight: 219 lbs Today's date: 11/11/2022 Total lbs lost to date: 23 Total lbs lost since last in-office visit: 1  Interim History: Patient developed bronchitis shortly after her last appointment and did get treated by her PCP and felt like she was getting better but is still dealing with very significant fatigue where she is struggling to get thru the day without a nap.  She is noticing she isn't emotionally eating but she isn't feeling well.  She was able to get a good amount of her normal food in over the last few weeks but it just took her longer to do it.    Subjective:   1. Prediabetes Patient's recent A1c was done with Dr. Renae Gloss.  She is not on medications.  2. Other disorder of eating Patient is no longer thinking of food, but she is feeling quite poorly.  She did get to 8/50 mg of Lomaira/topiramate.  Assessment/Plan:   1. Prediabetes Patient will have labs done with her PCP at her next appointment.  2. Other disorder of eating Patient is to stop Lomaira and Topamax for 2 weeks.  We will reevaluate fatigue after 2 weeks of medication vacation.  Refill pending on patient's reaction in 2 weeks.  3. BMI 40.0-44.9, adult (HCC)  4. Obesity with starting BMI of 47.2 [name] is currently in the action stage of change. As such, her goal is to continue with weight loss efforts. She has agreed to keeping a food journal and adhering to recommended goals of 1100-1200 calories and 90+ grams of  protein daily.   Exercise goals: All adults should avoid inactivity. Some physical activity is better than none, and adults who participate in any amount of physical activity gain some health benefits.  Behavioral modification strategies: increasing lean protein intake, meal planning and cooking strategies, keeping healthy foods in the home, and planning for success.  [name] has agreed to follow-up with our clinic in 6 weeks. She was informed of the importance of frequent follow-up visits to maximize her success with intensive lifestyle modifications for her multiple health conditions.   Objective:   Blood pressure 128/79, pulse (!) 111, temperature 97.8 F (36.6 C), height 5' (1.524 m), weight 219 lb (99.3 kg), SpO2 95%. Body mass index is 42.77 kg/m.  General: Cooperative, alert, well developed, in no acute distress. HEENT: Conjunctivae and lids unremarkable. Cardiovascular: Regular rhythm.  Lungs: Normal work of breathing. Neurologic: No focal deficits.   Lab Results  Component Value Date   CREATININE 0.74 05/21/2020   BUN 17 05/21/2020   NA 140 05/21/2020   K 4.8 05/21/2020   CL 103 05/21/2020   CO2 22 05/21/2020   Lab Results  Component Value Date   ALT 13 05/21/2020   AST 17 05/21/2020   ALKPHOS 114 05/21/2020   BILITOT 1.0 05/21/2020   Lab Results  Component Value Date   HGBA1C 5.3 05/21/2020   HGBA1C 5.8 (H) 11/02/2019   Lab Results  Component Value Date  INSULIN 27.8 (H) 05/21/2020   INSULIN 33.0 (H) 11/02/2019   Lab Results  Component Value Date   TSH 2.610 11/02/2019   Lab Results  Component Value Date   CHOL 254 (H) 05/21/2020   HDL 82 05/21/2020   LDLCALC 150 (H) 05/21/2020   TRIG 128 05/21/2020   Lab Results  Component Value Date   VD25OH 78.9 11/02/2019   Lab Results  Component Value Date   WBC 9.7 11/02/2019   HGB 15.6 11/02/2019   HCT 45.4 11/02/2019   MCV 89 11/02/2019   PLT 368 11/02/2019   No results found for: "IRON", "TIBC",  "FERRITIN"  Attestation Statements:   Reviewed by clinician on day of visit: allergies, medications, problem list, medical history, surgical history, family history, social history, and previous encounter notes.   I, Burt Knack, am acting as transcriptionist for Reuben Likes, MD.  I have reviewed the above documentation for accuracy and completeness, and I agree with the above. - Reuben Likes, MD

## 2022-12-25 ENCOUNTER — Ambulatory Visit (INDEPENDENT_AMBULATORY_CARE_PROVIDER_SITE_OTHER): Payer: Medicare Other | Admitting: Family Medicine

## 2022-12-29 ENCOUNTER — Ambulatory Visit (INDEPENDENT_AMBULATORY_CARE_PROVIDER_SITE_OTHER): Payer: Medicare Other | Admitting: Family Medicine

## 2022-12-29 ENCOUNTER — Encounter (INDEPENDENT_AMBULATORY_CARE_PROVIDER_SITE_OTHER): Payer: Self-pay | Admitting: Family Medicine

## 2022-12-29 VITALS — BP 150/63 | HR 114 | Temp 97.5°F | Ht 60.0 in | Wt 224.0 lb

## 2022-12-29 DIAGNOSIS — I10 Essential (primary) hypertension: Secondary | ICD-10-CM

## 2022-12-29 DIAGNOSIS — F5089 Other specified eating disorder: Secondary | ICD-10-CM

## 2022-12-29 DIAGNOSIS — E7849 Other hyperlipidemia: Secondary | ICD-10-CM

## 2022-12-29 DIAGNOSIS — Z6841 Body Mass Index (BMI) 40.0 and over, adult: Secondary | ICD-10-CM

## 2022-12-29 DIAGNOSIS — E669 Obesity, unspecified: Secondary | ICD-10-CM
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
## 2023-02-09 ENCOUNTER — Encounter (INDEPENDENT_AMBULATORY_CARE_PROVIDER_SITE_OTHER): Payer: Self-pay | Admitting: Family Medicine

## 2023-02-09 ENCOUNTER — Ambulatory Visit (INDEPENDENT_AMBULATORY_CARE_PROVIDER_SITE_OTHER): Payer: Medicare Other | Admitting: Family Medicine

## 2023-02-09 DIAGNOSIS — I10 Essential (primary) hypertension: Secondary | ICD-10-CM | POA: Insufficient documentation

## 2023-02-09 DIAGNOSIS — R7303 Prediabetes: Secondary | ICD-10-CM | POA: Insufficient documentation

## 2023-02-09 DIAGNOSIS — E669 Obesity, unspecified: Secondary | ICD-10-CM

## 2023-02-09 DIAGNOSIS — Z6841 Body Mass Index (BMI) 40.0 and over, adult: Secondary | ICD-10-CM

## 2023-02-09 NOTE — Assessment & Plan Note (Signed)
Patient previously on Ozempic and did well with that medication.  Due to lack of insurance coverage patient could not continue this medication.  She was planning to start compounded semaglutide at time of last appointment but never got the prescription. She is planning to reach out to her PCP for further management and figure out if she is sending Rx to the compounding pharmacy.  We discussed possible issues with dosing as I could not give guidance to the dosing and then titration as I have minimal experience with compounding.

## 2023-02-09 NOTE — Progress Notes (Signed)
SUBJECTIVE:  Chief Complaint: Obesity  Interim History: Patient voices she feels like she has not been as controlled food wise since last appointment.  She was planning on starting compounded semaglutide at time of last appointment.  She has not gotten the compounded medication yet.  She is feeling out of control with her eating.   [name] is here to discuss her progress with her obesity treatment plan. She is on the keeping a food journal and adhering to recommended goals of 1100-1200 calories and 85+ grams of protein and states she is following her eating plan approximately 30 % of the time. She states she is exercising 60 minutes 3 times per week.   OBJECTIVE: Visit Diagnoses: Problem List Items Addressed This Visit       Cardiovascular and Mediastinum   High blood pressure    On norvasc daily (10mg  daily).  Blood pressure well controlled today. She denies chest pressure, chest pain, headache.  She does not need refill today.         Other   Prediabetes    Patient previously on Ozempic and did well with that medication.  Due to lack of insurance coverage patient could not continue this medication.  She was planning to start compounded semaglutide at time of last appointment but never got the prescription. She is planning to reach out to her PCP for further management and figure out if she is sending Rx to the compounding pharmacy.  We discussed possible issues with dosing as I could not give guidance to the dosing and then titration as I have minimal experience with compounding.       Other Visit Diagnoses     Obesity with starting BMI of 47.2    -  Primary   BMI 40.0-44.9, adult (HCC)           Vitals Temp: 98.1 F (36.7 C) BP: 115/82 Pulse Rate: (!) 105 SpO2: 96 %   Anthropometric Measurements Height: 5' (1.524 m) Weight: 225 lb (102.1 kg) BMI (Calculated): 43.94 Weight at Last Visit: 224 lb Weight Lost Since Last Visit: 0 Weight Gained Since Last Visit:  1 Starting Weight: 242 lb Total Weight Loss (lbs): 17 lb (7.711 kg)   Body Composition  Body Fat %: 54.6 % Fat Mass (lbs): 123.2 lbs Muscle Mass (lbs): 97.2 lbs Visceral Fat Rating : 21   Other Clinical Data Today's Visit #: 57 Starting Date: 11/02/19     ASSESSMENT AND PLAN:  Diet: [name] is currently in the action stage of change. As such, her goal is to continue with weight loss efforts. She has agreed to keeping a food journal and adhering to recommended goals of 1100-1200 calories and 85 or more grams protein.  Exercise: [name] has been instructed that some exercise is better than none for weight loss and overall health benefits.   Behavior Modification:  We discussed the following Behavioral Modification Strategies today: increasing lean protein intake, increasing vegetables, emotional eating strategies , and keep a strict food journal.  We discussed at length various medication options for treatment of her obesity.  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  She will be pursuing compounded semaglutide. Will follow up on if she started this at next appointment.    No follow-ups on file.Marland Kitchen She was informed of the importance of frequent follow up visits to maximize her success with intensive lifestyle modifications for her multiple health conditions.  Attestation Statements:   Reviewed by clinician on day  of visit: allergies, medications, problem list, medical history, surgical history, family history, social history, and previous encounter notes.   Reuben Likes, MD

## 2023-02-09 NOTE — Assessment & Plan Note (Signed)
On norvasc daily (10mg  daily).  Blood pressure well controlled today. She denies chest pressure, chest pain, headache.  She does not need refill today.

## 2023-04-07 ENCOUNTER — Ambulatory Visit (INDEPENDENT_AMBULATORY_CARE_PROVIDER_SITE_OTHER): Payer: Medicare Other | Admitting: Family Medicine

## 2023-04-28 ENCOUNTER — Ambulatory Visit (INDEPENDENT_AMBULATORY_CARE_PROVIDER_SITE_OTHER): Payer: Medicare Other | Admitting: Family Medicine

## 2023-05-05 ENCOUNTER — Ambulatory Visit (INDEPENDENT_AMBULATORY_CARE_PROVIDER_SITE_OTHER): Payer: Medicare Other | Admitting: Family Medicine

## 2023-05-11 ENCOUNTER — Ambulatory Visit (INDEPENDENT_AMBULATORY_CARE_PROVIDER_SITE_OTHER): Payer: Medicare Other | Admitting: Family Medicine

## 2023-06-02 ENCOUNTER — Ambulatory Visit (INDEPENDENT_AMBULATORY_CARE_PROVIDER_SITE_OTHER): Payer: Medicare Other | Admitting: Family Medicine

## 2023-06-08 ENCOUNTER — Ambulatory Visit (INDEPENDENT_AMBULATORY_CARE_PROVIDER_SITE_OTHER): Payer: Medicare Other | Admitting: Family Medicine

## 2023-06-08 ENCOUNTER — Encounter (INDEPENDENT_AMBULATORY_CARE_PROVIDER_SITE_OTHER): Payer: Self-pay | Admitting: Family Medicine

## 2023-06-08 VITALS — BP 139/80 | HR 122 | Temp 98.1°F | Ht 60.0 in | Wt 230.0 lb

## 2023-06-08 DIAGNOSIS — F5089 Other specified eating disorder: Secondary | ICD-10-CM

## 2023-06-08 DIAGNOSIS — Z6841 Body Mass Index (BMI) 40.0 and over, adult: Secondary | ICD-10-CM | POA: Diagnosis not present

## 2023-06-08 DIAGNOSIS — F509 Eating disorder, unspecified: Secondary | ICD-10-CM | POA: Insufficient documentation

## 2023-06-08 DIAGNOSIS — R7303 Prediabetes: Secondary | ICD-10-CM | POA: Diagnosis not present

## 2023-06-08 MED ORDER — TOPIRAMATE ER 25 MG PO CAP24: 25.0000 mg | ORAL_CAPSULE | Freq: Every day | ORAL | 0 refills | Status: DC

## 2023-06-08 NOTE — Assessment & Plan Note (Signed)
 See anthropometric data  Will restart journal plan and plan to follow up in 2 weeks to reassess calories and protein intake and repeat IC.

## 2023-06-08 NOTE — Assessment & Plan Note (Signed)
 Patient is struggling with emotional eating much more significantly recently.  She was previously on ozempic with great results in terms of hunger control and carbohydrate intake control.  She is open to other options.  Will start on Trokendi 25mg  taken with dinner to see if this helps.  Follow up at next appointment for results.

## 2023-06-08 NOTE — Progress Notes (Signed)
 SUBJECTIVE:  Chief Complaint: Obesity  Interim History: Patient presents for follow up for first visit in 4 months.  She has been eating completely off plan- candy, ice cream, etc.  She still feels like she is in quite a funk mentally.  She isn't feeling as well with her physical weight gain.  She is looking for some direction more than anything else.   Cereniti is here to discuss her progress with her obesity treatment plan. She is on the keeping a food journal and adhering to recommended goals of 1100-1200 calories and 85 grams of protein and states she is following her eating plan approximately 0 % of the time. She states she is exercising 60 minutes 2-3 times per week.   OBJECTIVE: Visit Diagnoses: Problem List Items Addressed This Visit       Other   Prediabetes   Historical diagnosis.  She is getting labs done with PCP in the next few weeks.  Will follow up on lab results at next appointment.      Disorder of eating - Primary   Patient is struggling with emotional eating much more significantly recently.  She was previously on ozempic with great results in terms of hunger control and carbohydrate intake control.  She is open to other options.  Will start on Trokendi 25mg  taken with dinner to see if this helps.  Follow up at next appointment for results.      Relevant Medications   Topiramate ER (TROKENDI XR) 25 MG CP24   Morbid obesity (HCC)   See anthropometric data  Will restart journal plan and plan to follow up in 2 weeks to reassess calories and protein intake and repeat IC.      Other Visit Diagnoses       BMI 40.0-44.9, adult (HCC)           Vitals Temp: 98.1 F (36.7 C) BP: 139/80 Pulse Rate: (!) 122 SpO2: 95 %   Anthropometric Measurements Height: 5' (1.524 m) Weight: 230 lb (104.3 kg) BMI (Calculated): 44.92 Weight at Last Visit: 225 lb Weight Lost Since Last Visit: 0 Weight Gained Since Last Visit: 5 Starting Weight: 242 lb Total Weight Loss  (lbs): 12 lb (5.443 kg)   Body Composition  Body Fat %: 54.9 % Fat Mass (lbs): 126.8 lbs Muscle Mass (lbs): 98.8 lbs Visceral Fat Rating : 21   Other Clinical Data Today's Visit #: 11 Starting Date: 11/02/19 Comments: 1100-1200/85     ASSESSMENT AND PLAN:  Diet: [name] is currently in the action stage of change. As such, her goal is to continue with weight loss efforts and has agreed to keeping a food journal and adhering to recommended goals of 1100-1200 calories and 85 or more grams of protein daily. Patient to start food log or journaling meal plan.  The initial goal will be to habitually log or journal for at least 4 days a week.  The expectation it that patient may not initially meet calorie or protein goals as the nturitional understanding of food intake is begun.  We discussed the 10:1 ratio when reading a food label.  Patient agrees to keep a food log either electronically or on paper and bring to the next appointment to be able to dissect and discuss it with provider.    Exercise:  Older adults should determine their level of effort for physical activity relative to their level of fitness. and Older adults with chronic conditions should understand whether and how their conditions affect their  ability to do regular physical activity safely.  Behavior Modification:  We discussed the following Behavioral Modification Strategies today: increasing lean protein intake, increasing vegetables, meal planning and cooking strategies, planning for success, and keep a strict food journal.   Return in about 2 weeks (around 06/22/2023) for repeat IC.Marland Kitchen She was informed of the importance of frequent follow up visits to maximize her success with intensive lifestyle modifications for her multiple health conditions.  Attestation Statements:   Reviewed by clinician on day of visit: allergies, medications, problem list, medical history, surgical history, family history, social history, and  previous encounter notes.     Reuben Likes, MD

## 2023-06-08 NOTE — Assessment & Plan Note (Signed)
 Historical diagnosis.  She is getting labs done with PCP in the next few weeks.  Will follow up on lab results at next appointment.

## 2023-06-17 LAB — COMPREHENSIVE METABOLIC PANEL WITH GFR
Albumin: 4.6 (ref 3.5–5.0)
Calcium: 9.5 (ref 8.7–10.7)
EGFR: 81
Globulin: 2.2

## 2023-06-17 LAB — CBC AND DIFFERENTIAL
Hemoglobin: 14.3 (ref 12.0–16.0)
Neutrophils Absolute: 6.8
Platelets: 308 10*3/uL (ref 150–400)
WBC: 10.5

## 2023-06-17 LAB — HEPATIC FUNCTION PANEL
ALT: 19 U/L (ref 7–35)
AST: 24 (ref 13–35)
Alkaline Phosphatase: 117 (ref 25–125)
Bilirubin, Total: 0.7

## 2023-06-17 LAB — TSH
Free T4: 1.31 ng/dL
TSH: 3.62 (ref 0.41–5.90)

## 2023-06-17 LAB — BASIC METABOLIC PANEL WITH GFR
BUN: 10 (ref 4–21)
CO2: 25 — AB (ref 13–22)
Creatinine: 0.8 (ref 0.5–1.1)
Glucose: 108
Potassium: 4.5 meq/L (ref 3.5–5.1)
Sodium: 142 (ref 137–147)

## 2023-06-17 LAB — LIPID PANEL
Cholesterol: 228 — AB (ref 0–200)
HDL: 86 — AB (ref 35–70)
LDL Cholesterol: 125
Triglycerides: 101 (ref 40–160)

## 2023-06-17 LAB — CBC: RBC: 5.03 (ref 3.87–5.11)

## 2023-06-17 LAB — HEMOGLOBIN A1C: Hemoglobin A1C: 5.7

## 2023-06-17 LAB — VITAMIN D 25 HYDROXY (VIT D DEFICIENCY, FRACTURES): Vit D, 25-Hydroxy: 75.5

## 2023-06-25 ENCOUNTER — Encounter (INDEPENDENT_AMBULATORY_CARE_PROVIDER_SITE_OTHER): Payer: Self-pay | Admitting: Family Medicine

## 2023-06-25 ENCOUNTER — Ambulatory Visit (INDEPENDENT_AMBULATORY_CARE_PROVIDER_SITE_OTHER): Admitting: Family Medicine

## 2023-06-25 VITALS — BP 143/63 | HR 102 | Temp 98.1°F | Ht 60.0 in | Wt 228.0 lb

## 2023-06-25 DIAGNOSIS — R0602 Shortness of breath: Secondary | ICD-10-CM | POA: Diagnosis not present

## 2023-06-25 DIAGNOSIS — Z6841 Body Mass Index (BMI) 40.0 and over, adult: Secondary | ICD-10-CM | POA: Diagnosis not present

## 2023-06-25 DIAGNOSIS — R7303 Prediabetes: Secondary | ICD-10-CM | POA: Diagnosis not present

## 2023-06-25 NOTE — Assessment & Plan Note (Signed)
 Patient dyspnea not improved from initial visit.  Initial RMR 1449 on 11/12/19.  Repeat IC today showing RMR to now be 1656.  Can increase total calorie intake to 1300-1400 calorie with 95g of protein.

## 2023-06-25 NOTE — Progress Notes (Signed)
 SUBJECTIVE:  Chief Complaint: Obesity  Interim History: Patient started her food log again after last appointment.  She realized when she had portioned out and measured the nuts she was incorporating into her meal she was at times getting up to 3000 calories daily.  She has since decreased total nut intake and is finding herself to be more calorie controlled. She found that coming back into the clinic made it easier to get back into more nutritious eating habits.  Brought food log in today and some days she is close to 4000 calories and other days as low as 1100.  She is wondering about a probiotic she has been taking.   [name] is here to discuss her progress with her obesity treatment plan. She is on the keeping a food journal and adhering to recommended goals of 1100-1200 calories and 95 grams of protein and states she is following her eating plan approximately 100 % of the time. She states she is exercising with Tai Chi 60 minutes 2 times per week.   OBJECTIVE: Visit Diagnoses: Problem List Items Addressed This Visit       Other   Prediabetes   Recent A1c of 5.7 with PCP- labs all reviewed today.  Patient working on controlling simple carbohydrate intake and focus on increasing protein intake.      Morbid obesity (HCC)   Shortness of breath on exertion   Patient dyspnea not improved from initial visit.  Initial RMR 1449 on 11/12/19.  Repeat IC today showing RMR to now be 1656.  Can increase total calorie intake to 1300-1400 calorie with 95g of protein.      Other Visit Diagnoses       SOBOE (shortness of breath on exertion)    -  Primary     BMI 40.0-44.9, adult (HCC)           Vitals Temp: 98.1 F (36.7 C) BP: (!) 143/63 Pulse Rate: (!) 102 SpO2: 97 %   Anthropometric Measurements Height: 5' (1.524 m) Weight: 228 lb (103.4 kg) BMI (Calculated): 44.53 Weight at Last Visit: 230 lb Weight Lost Since Last Visit: 2 Weight Gained Since Last Visit: 0 Starting Weight:  242 lb Total Weight Loss (lbs): 14 lb (6.35 kg)   Body Composition  Body Fat %: 53.5 % Fat Mass (lbs): 122.2 lbs Muscle Mass (lbs): 100.8 lbs Total Body Water (lbs): 78.8 lbs Visceral Fat Rating : 21   Other Clinical Data RMR: 1656 Today's Visit #: 40 Starting Date: 11/02/19 Comments: 1100-1200/95     ASSESSMENT AND PLAN:  Diet: [name] is currently in the action stage of change. As such, her goal is to continue with weight loss efforts and has agreed to keeping a food journal and adhering to recommended goals of 1300-1400 calories and 95 or more grams of protein daily.   Exercise:  Older adults should determine their level of effort for physical activity relative to their level of fitness. and Older adults with chronic conditions should understand whether and how their conditions affect their ability to do regular physical activity safely.  Behavior Modification:  We discussed the following Behavioral Modification Strategies today: increasing lean protein intake, decreasing simple carbohydrates, no skipping meals, meal planning and cooking strategies, and keeping healthy foods in the home.   Return in about 3 weeks (around 07/16/2023).Marland Kitchen She was informed of the importance of frequent follow up visits to maximize her success with intensive lifestyle modifications for her multiple health conditions.  Attestation Statements:  Reviewed by clinician on day of visit: allergies, medications, problem list, medical history, surgical history, family history, social history, and previous encounter notes.    Reuben Likes, MD

## 2023-06-25 NOTE — Assessment & Plan Note (Signed)
 Recent A1c of 5.7 with PCP- labs all reviewed today.  Patient working on controlling simple carbohydrate intake and focus on increasing protein intake.

## 2023-07-09 ENCOUNTER — Encounter (INDEPENDENT_AMBULATORY_CARE_PROVIDER_SITE_OTHER): Payer: Self-pay | Admitting: Family Medicine

## 2023-07-09 ENCOUNTER — Telehealth: Payer: Self-pay

## 2023-07-09 ENCOUNTER — Ambulatory Visit (INDEPENDENT_AMBULATORY_CARE_PROVIDER_SITE_OTHER): Admitting: Family Medicine

## 2023-07-09 VITALS — BP 138/79 | HR 108 | Temp 98.2°F | Ht 60.0 in | Wt 228.0 lb

## 2023-07-09 DIAGNOSIS — Z6841 Body Mass Index (BMI) 40.0 and over, adult: Secondary | ICD-10-CM | POA: Diagnosis not present

## 2023-07-09 DIAGNOSIS — F5089 Other specified eating disorder: Secondary | ICD-10-CM

## 2023-07-09 DIAGNOSIS — R7303 Prediabetes: Secondary | ICD-10-CM

## 2023-07-09 NOTE — Telephone Encounter (Signed)
 Copied from CRM [id number]. Topic: Referral - Status >> Jul 08, 2023  3:45 PM Brennan Bailey S wrote: Reason for CRM: Dr. Renae Gloss office is calling because they sent in a referral for patient to have a scheduled appointment with [name]. Office would like [name] to contact patient to get her scheduled.  [phone] or [phone]  Routing to front desk for scheduling.

## 2023-07-09 NOTE — Progress Notes (Signed)
 SUBJECTIVE:  Chief Complaint: Obesity  Interim History: Patient here for follow up.  Emotionally she is feeling better.  She is trying to adjust breakfast and is finding that difficult.  She has cute back on the number of nuts from 25-30 to less than 10.  She is able to keep her calories close to goal.  She is getting around 30g at breakfast, 50g protein bar at night, large portion of meat at dinner.  She hasn't weighed her meat yet to get an estimation of how much it is.  She will also have a salad and half a baked potato.  She is getting around 1400 calories.  Just found out that she needs to repair or replace her deck.  [name] is here to discuss her progress with her obesity treatment plan. She is on the keeping a food journal and adhering to recommended goals of 1300-1400 calories and 95 grams of protein and states she is following her eating plan approximately 90 % of the time. She states she is exercising 60 minutes 3 times per week.  She is doing tai chi 2 times a week and walking up and down the stairs 12 times a day a couple times a week.   OBJECTIVE: Visit Diagnoses: Problem List Items Addressed This Visit       Other   Prediabetes - Primary   Has been more controlled of her indulgent eating since last appointment.  Decreasing her emotional eating tendencies.        Disorder of eating   Hadn't started trokendi because she did not feel like it was needed as she was better able to control her sweets intake.  Felt by emotionally relinquishing some of her anger and sadness she feels markedly better.      Morbid obesity (HCC)   Has previously been on Ozempic with good control of intake and significant weight loss.  Insurance no longer covering so she has been off medication and has experienced weight regain. She has been journaling more consistently.      Other Visit Diagnoses       BMI 40.0-44.9, adult (HCC)           Vitals Temp: 98.2 F (36.8 C) BP: 138/79 Pulse  Rate: (!) 108 SpO2: 97 %   Anthropometric Measurements Height: 5' (1.524 m) Weight: 228 lb (103.4 kg) BMI (Calculated): 44.53 Weight at Last Visit: 228 lb Weight Lost Since Last Visit: 0 Weight Gained Since Last Visit: 0 Starting Weight: 242 lb Total Weight Loss (lbs): 14 lb (6.35 kg)   Body Composition  Body Fat %: 55.2 % Fat Mass (lbs): 125.8 lbs Muscle Mass (lbs): 97 lbs Visceral Fat Rating : 21   Other Clinical Data Today's Visit #: 41 Starting Date: 11/02/19 Comments: 1300/1400/95     ASSESSMENT AND PLAN:  Diet: [name] is currently in the action stage of change. As such, her goal is to continue with weight loss efforts and has agreed to keeping a food journal and adhering to recommended goals of 1300-1400 calories and 95 or more grams protein daily.   Exercise:  Older adults should do exercises that maintain or improve balance if they are at risk of falling.  and Older adults should determine their level of effort for physical activity relative to their level of fitness.  Behavior Modification:  We discussed the following Behavioral Modification Strategies today: increasing lean protein intake, decreasing simple carbohydrates, meal planning and cooking strategies, avoiding temptations, and planning for success.  Return in about 4 weeks (around 08/06/2023).Marland Kitchen She was informed of the importance of frequent follow up visits to maximize her success with intensive lifestyle modifications for her multiple health conditions.  Attestation Statements:   Reviewed by clinician on day of visit: allergies, medications, problem list, medical history, surgical history, family history, social history, and previous encounter notes.    Reuben Likes, MD

## 2023-07-09 NOTE — Assessment & Plan Note (Signed)
 Has previously been on Ozempic with good control of intake and significant weight loss.  Insurance no longer covering so she has been off medication and has experienced weight regain. She has been journaling more consistently.

## 2023-07-09 NOTE — Assessment & Plan Note (Signed)
 Has been more controlled of her indulgent eating since last appointment.  Decreasing her emotional eating tendencies.

## 2023-07-09 NOTE — Assessment & Plan Note (Signed)
 Hadn't started trokendi because she did not feel like it was needed as she was better able to control her sweets intake.  Felt by emotionally relinquishing some of her anger and sadness she feels markedly better.

## 2023-07-15 ENCOUNTER — Telehealth: Payer: Self-pay

## 2023-07-15 NOTE — Telephone Encounter (Signed)
 Copied from CRM [id number]. Topic: Appointments - Scheduling Inquiry for Clinic >> Jul 15, 2023  3:25 PM [name] wrote: Reason for CRM: patient needs to schedule sleep study.  Routing to [facility] for scheduling.

## 2023-07-15 NOTE — Telephone Encounter (Signed)
 Dr. Elonda Hale put in a referral for sleep study on 07/09/2023.

## 2023-07-15 NOTE — Telephone Encounter (Signed)
 I called and spoke to pt. I in formed pt that our office did not schedule to [facility], her PCP did. I informed pt that the referral is still being processed through insurance due to just recently being sent and to give this more time. I informed pt that our office can not schedule orders that were not sent by our office and if she runs into issues with the [facility], she would have to contact her PCP. Pt verbalized understanding. NFN

## 2023-08-03 ENCOUNTER — Encounter (INDEPENDENT_AMBULATORY_CARE_PROVIDER_SITE_OTHER): Payer: Self-pay | Admitting: Family Medicine

## 2023-08-03 ENCOUNTER — Ambulatory Visit (INDEPENDENT_AMBULATORY_CARE_PROVIDER_SITE_OTHER): Admitting: Family Medicine

## 2023-08-03 VITALS — BP 111/74 | HR 107 | Temp 97.9°F | Ht 60.0 in | Wt 224.0 lb

## 2023-08-03 DIAGNOSIS — F5089 Other specified eating disorder: Secondary | ICD-10-CM

## 2023-08-03 DIAGNOSIS — Z6841 Body Mass Index (BMI) 40.0 and over, adult: Secondary | ICD-10-CM | POA: Diagnosis not present

## 2023-08-03 NOTE — Progress Notes (Signed)
   SUBJECTIVE:  Chief Complaint: Obesity  Interim History: Patient has had a difficult last few weeks. She also got word at the beginning of the month she needs to replace the deck off the back of her house and had some financial concerns about this.  She was very stressed about the financial hardships.  She has figured out the financing and is feeling a bit better about that now.  She is feeling better overall and feeling more stable.    [name] is here to discuss her progress with her obesity treatment plan. She is on the keeping a food journal and adhering to recommended goals of 1300-1400 calories and 95 grams of protein and states she is following her eating plan approximately 80-85 % of the time. She states she is exercising 60 minutes 3 times per week.   OBJECTIVE: Visit Diagnoses: Problem List Items Addressed This Visit       Other   Disorder of eating - Primary   Doing reasonably on trokendi .  No side effects mentioned and no refill needed.  She does not note any side effects on medication.      Morbid obesity (HCC)   Other Visit Diagnoses       BMI 40.0-44.9, adult (HCC)           Vitals Temp: 97.9 F (36.6 C) BP: 111/74 Pulse Rate: (!) 107 SpO2: 97 %   Anthropometric Measurements Height: 5' (1.524 m) Weight: 224 lb (101.6 kg) BMI (Calculated): 43.75 Weight at Last Visit: 228 lb Weight Lost Since Last Visit: 4 Weight Gained Since Last Visit: 0 Starting Weight: 242 lb Total Weight Loss (lbs): 18 lb (8.165 kg)   Body Composition  Body Fat %: 53.4 % Fat Mass (lbs): 120 lbs Muscle Mass (lbs): 99.2 lbs Total Body Water (lbs): 80 lbs Visceral Fat Rating : 20   Other Clinical Data Today's Visit #: 42 Starting Date: 11/02/19 Comments: 1300-1400/95     ASSESSMENT AND PLAN:  Diet: [name] is currently in the action stage of change. As such, her goal is to continue with weight loss efforts and has agreed to keeping a food journal and adhering to  recommended goals of 1300-1400 calories and 95 or more grams protein daily.   Exercise:  Older adults should determine their level of effort for physical activity relative to their level of fitness.  Patient to stay consistent with level of activity for the next few weeks.  Behavior Modification:  We discussed the following Behavioral Modification Strategies today: increasing lean protein intake, decreasing simple carbohydrates, increasing vegetables, emotional eating strategies , planning for success, and keep a strict food journal.   Return in about 4 weeks (around 08/31/2023).   She was informed of the importance of frequent follow up visits to maximize her success with intensive lifestyle modifications for her multiple health conditions.  Attestation Statements:   Reviewed by clinician on day of visit: allergies, medications, problem list, medical history, surgical history, family history, social history, and previous encounter notes.    Donaciano Frizzle, MD

## 2023-08-03 NOTE — Assessment & Plan Note (Signed)
 Doing reasonably on trokendi .  No side effects mentioned and no refill needed.  She does not note any side effects on medication.

## 2023-09-15 ENCOUNTER — Encounter (INDEPENDENT_AMBULATORY_CARE_PROVIDER_SITE_OTHER): Payer: Self-pay | Admitting: Family Medicine

## 2023-09-15 ENCOUNTER — Ambulatory Visit (INDEPENDENT_AMBULATORY_CARE_PROVIDER_SITE_OTHER): Admitting: Family Medicine

## 2023-09-15 VITALS — BP 119/81 | HR 109 | Temp 98.2°F | Ht 60.0 in | Wt 223.0 lb

## 2023-09-15 DIAGNOSIS — F5089 Other specified eating disorder: Secondary | ICD-10-CM

## 2023-09-15 DIAGNOSIS — Z6841 Body Mass Index (BMI) 40.0 and over, adult: Secondary | ICD-10-CM | POA: Diagnosis not present

## 2023-09-15 MED ORDER — TOPIRAMATE ER 25 MG PO CAP24: 25.0000 mg | ORAL_CAPSULE | Freq: Every day | ORAL | 0 refills | Status: DC

## 2023-09-15 NOTE — Progress Notes (Unsigned)
   SUBJECTIVE:  Chief Complaint: Obesity  Interim History: Patient went on a chocolate binge on ambien the other night and is still feeling ill from the binge.  Prior to that she has had a difficult 6 weeks.  She has been under quite a bit of stress with the deck replacement.  She wanted to go to [place] this past weekend but she didn't due to home remodeling. She has been increasing her social circle.  Seniah is here to discuss her progress with her obesity treatment plan. She is on the keeping a food journal and adhering to recommended goals of 1300-1400 calories and 95 grams of protein and states she is following her eating plan approximately 60 % of the time. She states she is exercising 1 hours 3 times per week.   OBJECTIVE: Visit Diagnoses: Problem List Items Addressed This Visit       Other   Disorder of eating   Relevant Medications   Topiramate  ER (TROKENDI  XR) 25 MG CP24    Vitals Temp: 98.2 F (36.8 C) BP: 119/81 Pulse Rate: (!) 109 SpO2: 94 %   Anthropometric Measurements Height: 5' (1.524 m) Weight: 223 lb (101.2 kg) BMI (Calculated): 43.55 Weight at Last Visit: 224 lb Weight Lost Since Last Visit: 1 Weight Gained Since Last Visit: 0 Starting Weight: 242 lb Total Weight Loss (lbs): 19 lb (8.618 kg)   Body Composition  Body Fat %: 53.4 % Fat Mass (lbs): 119 lbs Muscle Mass (lbs): 98.8 lbs Total Body Water (lbs): 77.8 lbs Visceral Fat Rating : 20   Other Clinical Data Today's Visit #: 55 Starting Date: 11/02/19 Comments: 1300-1400/95     ASSESSMENT AND PLAN:  Diet: Roquel is currently in the action stage of change. As such, her goal is to continue with weight loss efforts and has agreed to keeping a food journal and adhering to recommended goals of 1200-1400 calories and 95 or more grams of protein daily.   Exercise:  Older adults should determine their level of effort for physical activity relative to their level of  fitness.  Behavior Modification:  We discussed the following Behavioral Modification Strategies today: increasing lean protein intake, decreasing simple carbohydrates, increasing vegetables, meal planning and cooking strategies, and keeping healthy foods in the home.   Return in about 6 weeks (around 10/27/2023).   She was informed of the importance of frequent follow up visits to maximize her success with intensive lifestyle modifications for her multiple health conditions.  Attestation Statements:   Reviewed by clinician on day of visit: allergies, medications, problem list, medical history, surgical history, family history, social history, and previous encounter notes.    Donaciano Frizzle, MD

## 2023-09-21 NOTE — Assessment & Plan Note (Signed)
 Patient still aware of her indulgent eating tendencies in the evening.  She is working on increasing her awareness of food choices and limiting her access to simple carbohydrates.  Needs a refill of her topiramate  today.  Will continue topiramate  at current dose- no change in dose.

## 2023-09-21 NOTE — Assessment & Plan Note (Signed)
 Anthropometric Measurements Height: 5' (1.524 m) Weight: 223 lb (101.2 kg) BMI (Calculated): 43.55 Weight at Last Visit: 224 lb Weight Lost Since Last Visit: 1 Weight Gained Since Last Visit: 0 Starting Weight: 242 lb Total Weight Loss (lbs): 19 lb (8.618 kg) Body Composition  Body Fat %: 53.4 % Fat Mass (lbs): 119 lbs Muscle Mass (lbs): 98.8 lbs Total Body Water (lbs): 77.8 lbs Visceral Fat Rating : 20 Other Clinical Data Today's Visit #: 43 Starting Date: 11/02/19 Comments: 1300-1400/95

## 2023-09-23 NOTE — Progress Notes (Unsigned)
 09/24/23- 78 yoF never smoker for sleep evaluation courtesy of Dr Luke Lamer for suspected OSA, complicated by HTN, GERD, Morbid Obesity, PreDiabetes Patient didn't return calls from scheduler -Ambien 12.5, -----Wakes up in am blowing air out.  Also coughing with allergies Epworth score-1 Body weight today-228 lbs Discussed the use of AI scribe software for clinical note transcription with the patient, who gave verbal consent to proceed.  History of Present Illness   [name] is a 78 year old female who presents with concerns of possible sleep apnea. She was referred by her doctor for evaluation of possible sleep apnea.  She is unaware of snoring or apnea, and her partner, who uses a CPAP machine, has not observed any snoring or breathing interruptions. She goes to bed later than her partner, which may affect observation of her sleep patterns.  A neck fracture fifteen years ago resulted in significant pain, leading to the use of Ambien for sleep. She continues to use Ambien to manage pain and sleep issues.  She has experienced pneumonia and pleurisy in the past, but recent lung evaluations are normal. She required oxygen for three months following COVID-19 in 2019. She has not been diagnosed with asthma.  She underwent tonsillectomy and has no other surgeries related to her nose or throat. There is no family history of sleep apnea.  She does not experience excessive daytime sleepiness but feels slightly tired until having morning coffee. She takes a daily allergy tablet and experiences heavy morning mucus, which clears after coughing.     Prior to Admission medications   Medication Sig Start Date End Date Taking? Authorizing Provider  amLODipine (NORVASC) 10 MG tablet Take 10 mg by mouth daily.   Yes [provider]  Ascorbic Acid (VITAMIN C) 1000 MG tablet Take 1,000 mg by mouth daily.   Yes [provider]  b complex vitamins capsule Take 1 capsule by mouth daily.    Yes [provider]  cyclobenzaprine (FLEXERIL) 10 MG tablet Take 10 mg by mouth 2 (two) times daily as needed.   Yes [provider]  docusate sodium (COLACE) 100 MG capsule Take 100 mg by mouth 2 (two) times daily.   Yes [provider]  etodolac (LODINE) 400 MG tablet Take 400 mg by mouth as needed.    Yes [provider]  fluticasone (CUTIVATE) 0.05 % cream Apply topically as needed.   Yes [provider]  LEVOTHYROXINE SODIUM PO Take 25 mg by mouth.   Yes [provider]  Multiple Vitamins-Minerals (PROBIOTICS + BARIATRIC MULTI PO) Take by mouth. Bioma Probiotic   Yes [provider]  polyethylene glycol powder (GLYCOLAX /MIRALAX ) 17 GM/SCOOP powder Take 17 g by mouth daily. 08/23/20  Yes [place] Adelita PENNER, MD  Probiotic Product (PROBIOTIC DAILY PO) Take by mouth.   Yes [provider]  Topiramate  ER (TROKENDI  XR) 25 MG CP24 Take 1 capsule (25 mg total) by mouth daily. 09/15/23  Yes [place] Adelita PENNER, MD  zolpidem (AMBIEN CR) 12.5 MG CR tablet Take 12.5 mg by mouth at bedtime as needed.   Yes [provider]   Past Medical History:  Diagnosis Date   Acid reflux    Arthritis    Back pain    Bilateral swelling of feet    High blood pressure    Joint pain    Osteoarthritis    History reviewed. No pertinent surgical history. History reviewed. No pertinent family history. Social History   Socioeconomic History  Marital status: Divorced    Spouse name: Not on file   Number of children: Not on file   Years of education: Not on file   Highest education level: Not on file  Occupational History   Not on file  Tobacco Use   Smoking status: Never   Smokeless tobacco: Never  Substance and Sexual Activity   Alcohol use: Yes    Comment: ocassionally   Drug use: No   Sexual activity: Not Currently  Other Topics Concern   Not on file  Social History Narrative   Not on file   Social Drivers of  Health   Financial Resource Strain: Not on file  Food Insecurity: Not on file  Transportation Needs: Not on file  Physical Activity: Not on file  Stress: Not on file  Social Connections: Not on file  Intimate Partner Violence: Not on file   Assessment and Plan:    Sleep apnea evaluation Evaluated for sleep apnea due to history of neck fracture and use of Ambien. Explained long-term cardiovascular and cognitive risks of untreated sleep apnea. Discussed home sleep study process and potential outcomes. - Order home sleep study to evaluate for sleep apnea. - Discuss potential treatment options if sleep apnea is diagnosed, including CPAP, weight loss medications, and dental devices. - Provide after-visit summary with instructions to contact the office if no results are received two weeks after the study.  Neck fracture Chronic pain from neck fracture 15 years ago managed with Ambien, relevant to sleep issues and potential sleep apnea evaluation.  COVID-19 History of COVID-19 in 2019 with prolonged oxygen therapy, relevant to respiratory health and potential sleep apnea evaluation.  Pneumonia History of pneumonia relevant to respiratory health.   Abnormal Lung Exam - diffuse bibasilar crackles - CXR today     ROS-see HPI   + = positive Constitutional:    weight loss, night sweats, fevers, chills, fatigue, lassitude. HEENT:    headaches, difficulty swallowing, tooth/dental problems, sore throat,       sneezing, itching, ear ache, nasal congestion, post nasal drip, snoring CV:    chest pain, orthopnea, PND, swelling in lower extremities, anasarca,                                   dizziness, palpitations Resp:   +shortness of breath with exertion or at rest.                productive cough,   non-productive cough, coughing up of blood.              change in color of mucus.  wheezing.   Skin:    rash or lesions. GI:  +heartburn, indigestion, abdominal pain, nausea, vomiting,  diarrhea,                 change in bowel habits, loss of appetite GU: dysuria, change in color of urine, no urgency or frequency.   flank pain. MS:   joint pain, +stiffness, decreased range of motion, back pain. Neuro-     nothing unusual Psych:  change in mood or affect.  depression or anxiety.   memory loss.  OBJ- Physical Exam General- Alert, Oriented, Affect-appropriate, Distress- none acute, +obese Skin- rash-none, lesions- none, excoriation- none Lymphadenopathy- none Head- atraumatic            Eyes- Gross vision intact, PERRLA, conjunctivae and secretions clear  Ears- Hearing, canals-normal            Nose- Clear, no-Septal dev, mucus, polyps, erosion, perforation             Throat- Mallampati III , mucosa clear , drainage- none, tonsils- atrophic, +teeth Neck- flexible , trachea midline, no stridor , thyroid nl, carotid no bruit Chest - symmetrical excursion , unlabored           Heart/CV- RRR , no murmur , no gallop  , no rub, nl s1 s2                           - JVD- none , edema- none, stasis changes- none, varices- none           Lung- +fine crackles in bases, wheeze- none, cough- none , dullness-none, rub- none           Chest wall-  Abd-  Br/ Gen/ Rectal- Not done, not indicated Extrem- cyanosis- none, clubbing, none, atrophy- none, strength- nl Neuro- grossly intact to observation

## 2023-09-24 ENCOUNTER — Ambulatory Visit: Payer: Self-pay | Admitting: Internal Medicine

## 2023-09-24 ENCOUNTER — Encounter: Payer: Self-pay | Admitting: Internal Medicine

## 2023-09-24 ENCOUNTER — Ambulatory Visit

## 2023-09-24 VITALS — BP 136/82 | HR 121 | Temp 98.0°F | Ht 60.0 in | Wt 228.8 lb

## 2023-09-24 DIAGNOSIS — G4733 Obstructive sleep apnea (adult) (pediatric): Secondary | ICD-10-CM

## 2023-09-24 DIAGNOSIS — R918 Other nonspecific abnormal finding of lung field: Secondary | ICD-10-CM | POA: Diagnosis not present

## 2023-09-24 NOTE — Patient Instructions (Signed)
Order- schedule home sleep test   dx OSA  Please call us for results and recommendations about 2 weeks after your sleep test 

## 2023-09-29 ENCOUNTER — Ambulatory Visit: Payer: Self-pay | Admitting: Internal Medicine

## 2023-09-29 NOTE — Progress Notes (Signed)
 Pt.notified

## 2023-10-07 ENCOUNTER — Ambulatory Visit (INDEPENDENT_AMBULATORY_CARE_PROVIDER_SITE_OTHER): Admitting: Family Medicine

## 2023-10-07 ENCOUNTER — Encounter

## 2023-10-07 DIAGNOSIS — G4733 Obstructive sleep apnea (adult) (pediatric): Secondary | ICD-10-CM

## 2023-10-14 ENCOUNTER — Ambulatory Visit (INDEPENDENT_AMBULATORY_CARE_PROVIDER_SITE_OTHER): Admitting: Family Medicine

## 2023-10-14 ENCOUNTER — Encounter (INDEPENDENT_AMBULATORY_CARE_PROVIDER_SITE_OTHER): Payer: Self-pay | Admitting: Family Medicine

## 2023-10-14 VITALS — BP 135/84 | HR 64 | Temp 98.0°F | Ht 60.0 in | Wt 224.0 lb

## 2023-10-14 DIAGNOSIS — Z6841 Body Mass Index (BMI) 40.0 and over, adult: Secondary | ICD-10-CM | POA: Diagnosis not present

## 2023-10-14 DIAGNOSIS — R7303 Prediabetes: Secondary | ICD-10-CM

## 2023-10-14 NOTE — Progress Notes (Signed)
   SUBJECTIVE:  Chief Complaint: Obesity  Interim History: Since last appointment had a short and good visit with her brother and his two daughters.  Did not go to normal July 4th picnic because it was at a place where people smoked.  Her partner has been a bit more on edge recently so patient has had her patience tried. She has had a couple of dizzy spells when leaning over and standing back up.  Has gotten a bit more in control with her snacking and hasn't felt settled enough to try the medications we discussed.  She has gotten more in control of her snacking but hasn't been 100%.  [name] is here to discuss her progress with her obesity treatment plan. She is on the journaling 1200-1400 calories and 95 grams of protein and states she is following her eating plan approximately 80 % of the time. She states she is exercising 60 minutes 3 times per week.   OBJECTIVE: Visit Diagnoses: Problem List Items Addressed This Visit       Other   Prediabetes - Primary   Morbid obesity (HCC)   Other Visit Diagnoses       BMI 40.0-44.9, adult (HCC)           No data recorded      10/14/2023    1:00 PM 09/24/2023    2:04 PM 09/15/2023   11:00 AM  Vitals with BMI  Height 5' 0 5' 0 5' 0  Weight 224 lbs 228 lbs 13 oz 223 lbs  BMI 43.75 44.68 43.55  Systolic 135 136 880  Diastolic 84 82 81  Pulse 64 121 109       ASSESSMENT AND PLAN: Assessment & Plan Prediabetes Patient is working on control of intake of simple carbohydrates.  She is very aware of some of her emotional eating tendencies and has been not falling into old habits.  She will continue to work on limiting simple carbs and focus on adequate protein intake. Morbid obesity (HCC) Anthropometric Measurements Height: 5' (1.524 m) Weight: 224 lb (101.6 kg) BMI (Calculated): 43.75 Weight at Last Visit: 223 lb Weight Lost Since Last Visit: 0 Weight Gained Since Last Visit: 1 Starting Weight: 242 lb Total Weight Loss (lbs):  18 lb (8.165 kg) Body Composition  Body Fat %: 54.5 % Fat Mass (lbs): 122.6 lbs Muscle Mass (lbs): 97 lbs Visceral Fat Rating : 21 Other Clinical Data Today's Visit #: 44 Starting Date: 11/02/19 Comments: 1200-1400/95  BMI 40.0-44.9, adult (HCC)    Diet: [name] is currently in the action stage of change. As such, her goal is to continue with weight loss efforts and has agreed to keeping a food journal and adhering to recommended goals of 1200-1400 calories and 90 or more grams protein daily.   Exercise:  Older adults should determine their level of effort for physical activity relative to their level of fitness.  Behavior Modification:  We discussed the following Behavioral Modification Strategies today: increasing lean protein intake, decreasing simple carbohydrates, increasing vegetables, and better snacking choices.   Return in about 6 weeks (around 11/25/2023).   She was informed of the importance of frequent follow up visits to maximize her success with intensive lifestyle modifications for her multiple health conditions.  Attestation Statements:   Reviewed by clinician on day of visit: allergies, medications, problem list, medical history, surgical history, family history, social history, and previous encounter notes.     Sylvia Cho, MD

## 2023-10-21 DIAGNOSIS — G4733 Obstructive sleep apnea (adult) (pediatric): Secondary | ICD-10-CM | POA: Diagnosis not present

## 2023-10-26 NOTE — Assessment & Plan Note (Signed)
 Patient is working on control of intake of simple carbohydrates.  She is very aware of some of her emotional eating tendencies and has been not falling into old habits.  She will continue to work on limiting simple carbs and focus on adequate protein intake.

## 2023-10-26 NOTE — Assessment & Plan Note (Signed)
 Anthropometric Measurements Height: 5' (1.524 m) Weight: 224 lb (101.6 kg) BMI (Calculated): 43.75 Weight at Last Visit: 223 lb Weight Lost Since Last Visit: 0 Weight Gained Since Last Visit: 1 Starting Weight: 242 lb Total Weight Loss (lbs): 18 lb (8.165 kg) Body Composition  Body Fat %: 54.5 % Fat Mass (lbs): 122.6 lbs Muscle Mass (lbs): 97 lbs Visceral Fat Rating : 21 Other Clinical Data Today's Visit #: 44 Starting Date: 11/02/19 Comments: 1200-1400/95

## 2023-11-11 ENCOUNTER — Encounter (INDEPENDENT_AMBULATORY_CARE_PROVIDER_SITE_OTHER): Payer: Self-pay | Admitting: Family Medicine

## 2023-11-11 ENCOUNTER — Ambulatory Visit (INDEPENDENT_AMBULATORY_CARE_PROVIDER_SITE_OTHER): Admitting: Family Medicine

## 2023-11-11 VITALS — BP 129/73 | HR 115 | Temp 98.1°F | Ht 60.0 in | Wt 224.0 lb

## 2023-11-11 DIAGNOSIS — G4733 Obstructive sleep apnea (adult) (pediatric): Secondary | ICD-10-CM | POA: Insufficient documentation

## 2023-11-11 DIAGNOSIS — Z6841 Body Mass Index (BMI) 40.0 and over, adult: Secondary | ICD-10-CM

## 2023-11-11 DIAGNOSIS — I10 Essential (primary) hypertension: Secondary | ICD-10-CM | POA: Diagnosis not present

## 2023-11-11 MED ORDER — ZEPBOUND 2.5 MG/0.5ML ~~LOC~~ SOAJ: 2.5000 mg | SUBCUTANEOUS | 0 refills | Status: DC

## 2023-11-11 NOTE — Assessment & Plan Note (Signed)
 Recent sleep study showing total of 63 desaturations with lowest O2 was 70%.max density of AHI 3% elevated to 49.2. Patient has follow up appointment with sleep physician in September.  Interested in Zepbound  as adjunct to CPAP therapy.  2.5mg  Zepbound  prescription sent to pharmacy and prior auth started.

## 2023-11-11 NOTE — Progress Notes (Unsigned)
   SUBJECTIVE:  Chief Complaint: Obesity  Interim History: Patient here for follow up.  She had a quiet July and she was preparing for a visit from her brother.   [name] is here to discuss her progress with her obesity treatment plan. She is on the keeping a food journal and adhering to recommended goals of 1200-1400 calories and 90 grams of protein and states she is following her eating plan approximately 60-70 % of the time. She states she is exercising 1 hour 3 times per week.   OBJECTIVE: Visit Diagnoses: Problem List Items Addressed This Visit   None   Vitals Temp: 98.1 F (36.7 C) BP: 129/73 Pulse Rate: (!) 115 SpO2: 97 %   Anthropometric Measurements Height: 5' (1.524 m) Weight: 224 lb (101.6 kg) BMI (Calculated): 43.75 Weight at Last Visit: 224 lb Weight Lost Since Last Visit: 0 Weight Gained Since Last Visit: 0 Starting Weight: 242 lb Total Weight Loss (lbs): 18 lb (8.165 kg)   Body Composition  Body Fat %: 54.3 % Fat Mass (lbs): 122 lbs Muscle Mass (lbs): 97.4 lbs Visceral Fat Rating : 21   Other Clinical Data Today's Visit #: 45 Starting Date: 11/02/19 Comments: 1200-1400/90     ASSESSMENT AND PLAN:  Diet: Karyssa is currently in the action stage of change. As such, her goal is to continue with weight loss efforts and has agreed to {MWMwtlossportion/plan2:23431}.   Exercise:  {MWM Exercise Recommendations:210964029}  Behavior Modification:  We discussed the following Behavioral Modification Strategies today: {HWW Behavior Modification:210964008}. We discussed various medication options to help Maleny with her weight loss efforts and we both agreed to ***.  No follow-ups on file.   She was informed of the importance of frequent follow up visits to maximize her success with intensive lifestyle modifications for her multiple health conditions.  Attestation Statements:   Reviewed by clinician on day of visit: allergies, medications, problem  list, medical history, surgical history, family history, social history, and previous encounter notes.   Time spent on visit including pre-visit chart review and post-visit care and charting was *** minutes  [name], MD

## 2023-11-24 ENCOUNTER — Telehealth (INDEPENDENT_AMBULATORY_CARE_PROVIDER_SITE_OTHER): Payer: Self-pay

## 2023-11-24 NOTE — Telephone Encounter (Signed)
 ZEPBOUND  INJ 2.5/0.5, use as directed, is approved for a non-formulary exception through 03/30/2024 under your Medicare Part D benefit.

## 2023-12-09 NOTE — Progress Notes (Unsigned)
 09/24/23- 78 yoF never smoker for sleep evaluation courtesy of Dr Luke Lamer for suspected OSA, complicated by HTN, GERD, Morbid Obesity, PreDiabetes Patient didn't return calls from scheduler -Ambien 12.5, -----Wakes up in am blowing air out.  Also coughing with allergies Epworth score-1 Body weight today-228 lbs Discussed the use of AI scribe software for clinical note transcription with the patient, who gave verbal consent to proceed.  History of Present Illness   [name] is a 78 year old female who presents with concerns of possible sleep apnea. She was referred by her doctor for evaluation of possible sleep apnea.  She is unaware of snoring or apnea, and her partner, who uses a CPAP machine, has not observed any snoring or breathing interruptions. She goes to bed later than her partner, which may affect observation of her sleep patterns.  A neck fracture fifteen years ago resulted in significant pain, leading to the use of Ambien for sleep. She continues to use Ambien to manage pain and sleep issues.  She has experienced pneumonia and pleurisy in the past, but recent lung evaluations are normal. She required oxygen for three months following COVID-19 in 2019. She has not been diagnosed with asthma.  She underwent tonsillectomy and has no other surgeries related to her nose or throat. There is no family history of sleep apnea.  She does not experience excessive daytime sleepiness but feels slightly tired until having morning coffee. She takes a daily allergy tablet and experiences heavy morning mucus, which clears after coughing.      Assessment and Plan:    Sleep apnea evaluation Evaluated for sleep apnea due to history of neck fracture and use of Ambien. Explained long-term cardiovascular and cognitive risks of untreated sleep apnea. Discussed home sleep study process and potential outcomes. - Order home sleep study to evaluate for sleep apnea. - Discuss potential treatment  options if sleep apnea is diagnosed, including CPAP, weight loss medications, and dental devices. - Provide after-visit summary with instructions to contact the office if no results are received two weeks after the study.  Neck fracture- hx Chronic pain from neck fracture 15 years ago managed with Ambien, relevant to sleep issues and potential sleep apnea evaluation.  COVID-19 History of COVID-19 in 2019 with prolonged oxygen therapy, relevant to respiratory health and potential sleep apnea evaluation.  Pneumonia History of pneumonia relevant to respiratory health.  Abnormal Lung Exam - diffuse bibasilar crackles - CXR today    12/10/23-78 yoF never smoker followed for  OSA, complicated by HTN, GERD, Morbid Obesity, PreDiabetes HST([facility])- 10/07/23- AHI 9.5/hr, desat to 70%, body weight 225 lbs Body weight today-229 lbs For treatment decision Discussed the use of AI scribe software for clinical note transcription with the patient, who gave verbal consent to proceed.  History of Present Illness   [name] is a 78 year old female with a history of severe COVID-19 who presents with mild sleep apnea and respiratory concerns.  A sleep study indicates mild sleep apnea with an AHI between nine and ten. She uses an adjustable bed and sometimes sleeps propped up, which provides some relief. She has an old fitted mouthpiece, though its current suitability is uncertain.  She experienced severe COVID-19, requiring seven weeks of home oxygen therapy, and took about a year to regain her activity levels. She has had pleurisy and pneumonia three times and is currently recovering from congestion. Occasional wheezing occurs, and she possesses two inhalers, though their names and regular use are not recalled.  Her daughter recently brought home a respiratory illness/ coryza, contributing to her current symptoms. She has had COVID-19 twice and plans to receive another vaccination soon.     Assessment and  Plan:    Mild obstructive sleep apnea Mild obstructive sleep apnea with AHI 9-10, indicating mild range. Mechanical airway obstruction due to sagging and relaxation of soft tissues. Treatment options include weight management, positional therapy, CPAP, and dental appliances. Her partner uses CPAP and she is not eager to try it herself. - Refer to sleep dentistry specialist for evaluation of current dental appliance and potential fitting of a new one.  Pulmonary crackles of unclear etiology Fine crackles again noted with no chest x-ray abnormalities. Possible causes include residual fluid, fibrosis, chronic bronchiolitis. She attributes it to current cold, although similar exam findings at last visit. We are waiting for now on CT scan, but keep it in mind if symptoms get worse. - Advise use of available inhalers to assess symptom improvement. - Monitor for persistence or worsening of symptoms and seek further evaluation if necessary.     CXR 09/23/23- IMPRESSION: No acute cardiopulmonary abnormality.  ROS-see HPI   + = positive Constitutional:    weight loss, night sweats, fevers, chills, fatigue, lassitude. HEENT:    headaches, difficulty swallowing, tooth/dental problems, sore throat,       sneezing, itching, ear ache, nasal congestion, post nasal drip, snoring CV:    chest pain, orthopnea, PND, swelling in lower extremities, anasarca,                                   dizziness, palpitations Resp:   +shortness of breath with exertion or at rest.                productive cough,   non-productive cough, coughing up of blood.              change in color of mucus.  wheezing.   Skin:    rash or lesions. GI:  +heartburn, indigestion, abdominal pain, nausea, vomiting, diarrhea,                 change in bowel habits, loss of appetite GU: dysuria, change in color of urine, no urgency or frequency.   flank pain. MS:   joint pain, +stiffness, decreased range of motion, back pain. Neuro-      nothing unusual Psych:  change in mood or affect.  depression or anxiety.   memory loss.  OBJ- Physical Exam General- Alert, Oriented, Affect-appropriate, Distress- none acute, +obese Skin- rash-none, lesions- none, excoriation- none Lymphadenopathy- none Head- atraumatic            Eyes- Gross vision intact, PERRLA, conjunctivae and secretions clear            Ears- Hearing, canals-normal            Nose- Clear, no-Septal dev, mucus, polyps, erosion, perforation             Throat- Mallampati III , mucosa clear , drainage- none, tonsils- atrophic, +teeth Neck- flexible , trachea midline, no stridor , thyroid nl, carotid no bruit Chest - symmetrical excursion , unlabored           Heart/CV- RRR , no murmur , no gallop  , no rub, nl s1 s2                           -  JVD- none , edema- none, stasis changes- none, varices- none           Lung- +fine crackles in bases, wheeze- none, cough- none , dullness-none, rub- none           Chest wall-  Abd-  Br/ Gen/ Rectal- Not done, not indicated Extrem- cyanosis- none, clubbing, none, atrophy- none, strength- nl Neuro- grossly intact to observation

## 2023-12-10 ENCOUNTER — Encounter (INDEPENDENT_AMBULATORY_CARE_PROVIDER_SITE_OTHER): Payer: Self-pay | Admitting: Family Medicine

## 2023-12-10 ENCOUNTER — Ambulatory Visit (INDEPENDENT_AMBULATORY_CARE_PROVIDER_SITE_OTHER): Admitting: Family Medicine

## 2023-12-10 ENCOUNTER — Encounter: Payer: Self-pay | Admitting: Internal Medicine

## 2023-12-10 ENCOUNTER — Ambulatory Visit (INDEPENDENT_AMBULATORY_CARE_PROVIDER_SITE_OTHER): Admitting: Internal Medicine

## 2023-12-10 VITALS — BP 138/83 | HR 115 | Temp 98.4°F | Ht 60.0 in | Wt 224.0 lb

## 2023-12-10 VITALS — BP 122/82 | HR 120 | Temp 98.0°F | Ht 60.25 in | Wt 229.2 lb

## 2023-12-10 DIAGNOSIS — F5089 Other specified eating disorder: Secondary | ICD-10-CM | POA: Diagnosis not present

## 2023-12-10 DIAGNOSIS — G4733 Obstructive sleep apnea (adult) (pediatric): Secondary | ICD-10-CM

## 2023-12-10 DIAGNOSIS — R7303 Prediabetes: Secondary | ICD-10-CM | POA: Diagnosis not present

## 2023-12-10 DIAGNOSIS — Z6841 Body Mass Index (BMI) 40.0 and over, adult: Secondary | ICD-10-CM

## 2023-12-10 MED ORDER — TOPIRAMATE 25 MG PO TABS: 25.0000 mg | ORAL_TABLET | Freq: Two times a day (BID) | ORAL | 0 refills | Status: DC

## 2023-12-10 MED ORDER — TOPIRAMATE ER 25 MG PO CAP24: 25.0000 mg | ORAL_CAPSULE | Freq: Two times a day (BID) | ORAL | 0 refills | Status: DC

## 2023-12-10 NOTE — Progress Notes (Signed)
 SUBJECTIVE:  Chief Complaint: Obesity  Interim History: patient just seen by Dr. Neysa for follow up on her sleep study.  She is on the tail end of an URI.  Her daughter was sick for a week about 3 weeks ago. Patient just for a new furnace and her car broke down so she needs to get her finances back in order.  She has been about the same in terms of her food intake since her last appointment.  Her food is good and she is mindful of intake. Nighttime eating is what is trickling in and she tends to overeat. She is eating a bit of cream cheese at night with pretzels.   [name] is here to discuss her progress with her obesity treatment plan. She is on the keeping a food journal and adhering to recommended goals of 1200-1400 calories and 90 grams of protein and states she is following her eating plan approximately 60 % of the time. She states she is not exercising.  OBJECTIVE: Visit Diagnoses: Problem List Items Addressed This Visit       Other   Disorder of eating - Primary   Relevant Medications   topiramate  (TOPAMAX ) 25 MG tablet   Morbid obesity (HCC)   Other Visit Diagnoses       BMI 40.0-44.9, adult (HCC)           Vitals Temp: 98.4 F (36.9 C) BP: 138/83 Pulse Rate: (!) 115 SpO2: 95 %   Anthropometric Measurements Height: 5' (1.524 m) Weight: 224 lb (101.6 kg) BMI (Calculated): 43.75 Weight at Last Visit: 224 lb Weight Lost Since Last Visit: 0 Weight Gained Since Last Visit: 0 Starting Weight: 242 lb Total Weight Loss (lbs): 18 lb (8.165 kg)   Body Composition  Body Fat %: 54.9 % Fat Mass (lbs): 123.4 lbs Muscle Mass (lbs): 96 lbs Visceral Fat Rating : 21   Other Clinical Data Today's Visit #: 46 Starting Date: 11/02/19 Comments: 1200-1400/90     ASSESSMENT AND PLAN: Assessment & Plan Prediabetes Previously elevated A1c on last labs.  Prior to that A1c controlled but patient was on Ozempic .  Will continue to implement lifestyle changes to assist in  blood sugar control. Other disorder of eating Patient is open to taking topiramate  to help with indulgent eating and mood. Needs a refill today.   Morbid obesity (HCC)  BMI 40.0-44.9, adult (HCC)    Diet: [name] is currently in the action stage of change. As such, her goal is to continue with weight loss efforts and has agreed to keeping a food journal and adhering to recommended goals of 1200-1400 calories and 90 or more grams of protein.   Exercise:  Older adults should determine their level of effort for physical activity relative to their level of fitness.  Behavior Modification:  We discussed the following Behavioral Modification Strategies today: increasing lean protein intake, decreasing simple carbohydrates, increasing vegetables, meal planning and cooking strategies, planning for success, and keep a strict food journal. We discussed various medication options to help [name] with her weight loss efforts and we both agreed to continue topiramate  but at twice a day dosing.  Return in about 6 weeks (around 01/21/2024).   She was informed of the importance of frequent follow up visits to maximize her success with intensive lifestyle modifications for her multiple health conditions.  Attestation Statements:   Reviewed by clinician on day of visit: allergies, medications, problem list, medical history, surgical history, family history, social history, and previous encounter  notes.   [name], MD

## 2023-12-10 NOTE — Patient Instructions (Signed)
 Order- referral to Dr Oneil Forget, DDS, Orthodontist    consider oral appliance for OSA  Try using your old inhaler to see if it helps. We have a lot we can do to help if your chest doesn't clear up.

## 2023-12-11 ENCOUNTER — Encounter: Payer: Self-pay | Admitting: Internal Medicine

## 2023-12-22 NOTE — Assessment & Plan Note (Signed)
 Patient is open to taking topiramate  to help with indulgent eating and mood. Needs a refill today.

## 2023-12-22 NOTE — Assessment & Plan Note (Signed)
 Previously elevated A1c on last labs.  Prior to that A1c controlled but patient was on Ozempic .  Will continue to implement lifestyle changes to assist in blood sugar control.

## 2024-01-20 ENCOUNTER — Ambulatory Visit (INDEPENDENT_AMBULATORY_CARE_PROVIDER_SITE_OTHER): Admitting: Family Medicine

## 2024-01-20 ENCOUNTER — Encounter (INDEPENDENT_AMBULATORY_CARE_PROVIDER_SITE_OTHER): Payer: Self-pay | Admitting: Family Medicine

## 2024-01-20 VITALS — BP 139/80 | HR 109 | Temp 98.1°F | Ht 60.0 in | Wt 220.0 lb

## 2024-01-20 DIAGNOSIS — R7303 Prediabetes: Secondary | ICD-10-CM | POA: Diagnosis not present

## 2024-01-20 DIAGNOSIS — F5089 Other specified eating disorder: Secondary | ICD-10-CM

## 2024-01-20 DIAGNOSIS — Z6841 Body Mass Index (BMI) 40.0 and over, adult: Secondary | ICD-10-CM | POA: Diagnosis not present

## 2024-01-20 MED ORDER — TOPIRAMATE 25 MG PO TABS: 25.0000 mg | ORAL_TABLET | Freq: Two times a day (BID) | ORAL | 0 refills | Status: DC

## 2024-01-20 NOTE — Progress Notes (Signed)
   SUBJECTIVE:  Chief Complaint: Obesity  Interim History: Patient voices the last few weeks have had some familial stress.  She has been doing well monitoring her food intake and focusing on her protein intake.  She has been getting in more control of her intake. She is still eating and has been more conscious of her indulgent food intake. She has started an ensure in the evening to get the extra 30 grams of protein in the evening.   [name] is here to discuss her progress with her obesity treatment plan. She is on the keeping a food journal and adhering to recommended goals of 1200-1400 calories and 90 grams of protein and states she is following her eating plan approximately 85-90 % of the time. She states she is exercising 60 minutes 3 times per week.   OBJECTIVE: Visit Diagnoses: Problem List Items Addressed This Visit       Other   Prediabetes - Primary   Disorder of eating   Relevant Medications   topiramate  (TOPAMAX ) 25 MG tablet   Morbid obesity (HCC)   Other Visit Diagnoses       BMI 40.0-44.9, adult (HCC)           Vitals Temp: 98.1 F (36.7 C) BP: 139/80 Pulse Rate: (!) 109 SpO2: 99 %   Anthropometric Measurements Height: 5' (1.524 m) Weight: 220 lb (99.8 kg) BMI (Calculated): 42.97 Weight at Last Visit: 224 lb Weight Lost Since Last Visit: 4 Weight Gained Since Last Visit: 0 Starting Weight: 242 lb Total Weight Loss (lbs): 22 lb (9.979 kg)   Body Composition  Body Fat %: 54.2 % Fat Mass (lbs): 119.4 lbs Muscle Mass (lbs): 95.8 lbs Visceral Fat Rating : 20   Other Clinical Data Today's Visit #: 46 Starting Date: 11/02/19 Comments: 1200-1400/90     ASSESSMENT AND PLAN: Assessment & Plan Other disorder of eating Tolerating topiramate  once a day but reports still having significant snacking and craving tendencies.  Will increase to topiramate  2 times daily to see if that increase in dosage helps with dietary intake.  Discussed possibility of  side effects today. Prediabetes Patient continue to work on dietary changes and lifestyle modifications for better blood sugar management.  Previously she was approved for semaglutide  which she tolerated and did very well on.  Will follow-up at patient's next primary care appointment to see what repeat A1c is. Morbid obesity (HCC)  BMI 40.0-44.9, adult (HCC)    Diet: [name] is currently in the action stage of change. As such, her goal is to continue with weight loss efforts and has agreed to keeping a food journal and adhering to recommended goals of 1200-1400 calories and 90 protein.   Exercise:  Older adults should determine their level of effort for physical activity relative to their level of fitness.  Behavior Modification:  We discussed the following Behavioral Modification Strategies today: increasing lean protein intake, decreasing simple carbohydrates, increasing vegetables, meal planning and cooking strategies, and keep a strict food journal.  Return in about 5 weeks (around 02/24/2024).   She was informed of the importance of frequent follow up visits to maximize her success with intensive lifestyle modifications for her multiple health conditions.  Attestation Statements:   Reviewed by clinician on day of visit: allergies, medications, problem list, medical history, surgical history, family history, social history, and previous encounter notes.     Sylvia Cho, MD

## 2024-01-21 ENCOUNTER — Other Ambulatory Visit (INDEPENDENT_AMBULATORY_CARE_PROVIDER_SITE_OTHER): Payer: Self-pay | Admitting: Family Medicine

## 2024-01-21 DIAGNOSIS — F5089 Other specified eating disorder: Secondary | ICD-10-CM

## 2024-01-27 NOTE — Assessment & Plan Note (Signed)
 Patient continue to work on dietary changes and lifestyle modifications for better blood sugar management.  Previously she was approved for semaglutide  which she tolerated and did very well on.  Will follow-up at patient's next primary care appointment to see what repeat A1c is.

## 2024-01-27 NOTE — Assessment & Plan Note (Signed)
 Tolerating topiramate  once a day but reports still having significant snacking and craving tendencies.  Will increase to topiramate  2 times daily to see if that increase in dosage helps with dietary intake.  Discussed possibility of side effects today.

## 2024-02-24 ENCOUNTER — Ambulatory Visit (INDEPENDENT_AMBULATORY_CARE_PROVIDER_SITE_OTHER): Payer: Self-pay | Admitting: Family Medicine

## 2024-02-24 ENCOUNTER — Encounter (INDEPENDENT_AMBULATORY_CARE_PROVIDER_SITE_OTHER): Payer: Self-pay | Admitting: Family Medicine

## 2024-02-24 VITALS — BP 123/82 | HR 103 | Temp 98.1°F | Ht 60.0 in | Wt 216.0 lb

## 2024-02-24 DIAGNOSIS — I10 Essential (primary) hypertension: Secondary | ICD-10-CM

## 2024-02-24 DIAGNOSIS — Z6841 Body Mass Index (BMI) 40.0 and over, adult: Secondary | ICD-10-CM | POA: Diagnosis not present

## 2024-02-24 DIAGNOSIS — R7303 Prediabetes: Secondary | ICD-10-CM

## 2024-02-24 NOTE — Assessment & Plan Note (Addendum)
 Patient is working on staying consistent with her food intake. She previously did very well on ozempic  when her insurance covered it.  Will continue current treatment strategy and follow-up at next appointment as to how well patient has been able to monitor and control simple carbohydrate intake.

## 2024-02-24 NOTE — Progress Notes (Signed)
   SUBJECTIVE:  Chief Complaint: Obesity  Interim History: patient having a different Thanksgiving this year with different people coming to her home and some of her family not coming.  She is working on self care and trying to be mindful of her intake and content of intake. She has been being more in line with her nutrition goals.  [name] is here to discuss her progress with her obesity treatment plan. She is on the keeping a food journal and adhering to recommended goals of 1200-1400 calories and 90 grams of protein and states she is following her eating plan approximately 80 % of the time. She states she is exercising 30-60 minutes 5 times per week.   OBJECTIVE: Visit Diagnoses: Problem List Items Addressed This Visit       Other   Prediabetes - Primary   Morbid obesity (HCC)   Other Visit Diagnoses       Essential hypertension         BMI 40.0-44.9, adult (HCC)           Vitals Temp: 98.1 F (36.7 C) BP: 123/82 Pulse Rate: (!) 103 SpO2: 96 %   Anthropometric Measurements Height: 5' (1.524 m) Weight: 216 lb (98 kg) BMI (Calculated): 42.18 Weight at Last Visit: 220 lb Weight Lost Since Last Visit: 4 Weight Gained Since Last Visit: 0 Starting Weight: 242 lb Total Weight Loss (lbs): 26 lb (11.8 kg)   Body Composition  Body Fat %: 53.7 % Fat Mass (lbs): 116 lbs Muscle Mass (lbs): 95 lbs Visceral Fat Rating : 20   Other Clinical Data Today's Visit #: 48 Starting Date: 11/02/19 Comments: 1200/1400/90     ASSESSMENT AND PLAN: Assessment & Plan Prediabetes Patient is working on staying consistent with her food intake. She previously did very well on ozempic  when her insurance covered it.  Will continue current treatment strategy and follow-up at next appointment as to how well patient has been able to monitor and control simple carbohydrate intake. Essential hypertension Blood pressure remains very well-controlled today.  No change in current treatment in  terms of medication or dosage. Morbid obesity (HCC)  BMI 40.0-44.9, adult (HCC)    Diet: [name] is currently in the action stage of change. As such, her goal is to continue with weight loss efforts and has agreed to keeping a food journal and adhering to recommended goals of 1200-1400 calories and 90 or more grams of protein daily.   Exercise:  Older adults should determine their level of effort for physical activity relative to their level of fitness.  Behavior Modification:  We discussed the following Behavioral Modification Strategies today: increasing lean protein intake, decreasing simple carbohydrates, increasing vegetables, meal planning and cooking strategies, and keep a strict food journal.   Return in about 6 weeks (around 04/06/2024).   She was informed of the importance of frequent follow up visits to maximize her success with intensive lifestyle modifications for her multiple health conditions.  Attestation Statements:   Reviewed by clinician on day of visit: allergies, medications, problem list, medical history, surgical history, family history, social history, and previous encounter notes.     Sylvia Cho, MD

## 2024-04-05 ENCOUNTER — Encounter (INDEPENDENT_AMBULATORY_CARE_PROVIDER_SITE_OTHER): Payer: Self-pay | Admitting: Family Medicine

## 2024-04-05 ENCOUNTER — Ambulatory Visit (INDEPENDENT_AMBULATORY_CARE_PROVIDER_SITE_OTHER): Admitting: Family Medicine

## 2024-04-05 VITALS — BP 152/81 | HR 120 | Temp 98.4°F | Ht 60.0 in | Wt 217.0 lb

## 2024-04-05 DIAGNOSIS — I10 Essential (primary) hypertension: Secondary | ICD-10-CM | POA: Diagnosis not present

## 2024-04-05 DIAGNOSIS — Z6841 Body Mass Index (BMI) 40.0 and over, adult: Secondary | ICD-10-CM | POA: Diagnosis not present

## 2024-04-05 DIAGNOSIS — F5089 Other specified eating disorder: Secondary | ICD-10-CM

## 2024-04-05 MED ORDER — TOPIRAMATE 25 MG PO TABS: 25.0000 mg | ORAL_TABLET | Freq: Two times a day (BID) | ORAL | 0 refills | Status: DC

## 2024-04-05 NOTE — Assessment & Plan Note (Signed)
 On topiramate  daily and ran out.  Some change in mood since getting off med but will follow up at next appointment to discuss if getting back on med improves mood.
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
## 2024-04-14 ENCOUNTER — Ambulatory Visit (INDEPENDENT_AMBULATORY_CARE_PROVIDER_SITE_OTHER): Admitting: Family Medicine

## 2024-05-04 ENCOUNTER — Ambulatory Visit (INDEPENDENT_AMBULATORY_CARE_PROVIDER_SITE_OTHER): Admitting: Family Medicine

## 2024-05-04 ENCOUNTER — Encounter (INDEPENDENT_AMBULATORY_CARE_PROVIDER_SITE_OTHER): Payer: Self-pay | Admitting: Family Medicine

## 2024-05-04 VITALS — BP 136/80 | HR 107 | Temp 98.2°F | Ht 60.0 in | Wt 213.0 lb

## 2024-05-04 DIAGNOSIS — Z6841 Body Mass Index (BMI) 40.0 and over, adult: Secondary | ICD-10-CM

## 2024-05-04 DIAGNOSIS — F5089 Other specified eating disorder: Secondary | ICD-10-CM

## 2024-05-04 DIAGNOSIS — I10 Essential (primary) hypertension: Secondary | ICD-10-CM

## 2024-05-04 MED ORDER — TOPIRAMATE 25 MG PO TABS: 25.0000 mg | ORAL_TABLET | Freq: Two times a day (BID) | ORAL | 0 refills | Status: AC
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
## 2024-06-02 ENCOUNTER — Ambulatory Visit (INDEPENDENT_AMBULATORY_CARE_PROVIDER_SITE_OTHER): Admitting: Family Medicine
